# Patient Record
Sex: Female | Born: 1965 | Race: White | Hispanic: No | Marital: Single | Smoking: Never smoker
Health system: Southern US, Community
[De-identification: ages and names within clinical notes are randomized; demographics above are authoritative.]

## PROBLEM LIST (undated history)

## (undated) DIAGNOSIS — M7751 Other enthesopathy of right foot: Secondary | ICD-10-CM

## (undated) DIAGNOSIS — R002 Palpitations: Secondary | ICD-10-CM

## (undated) DIAGNOSIS — R079 Chest pain, unspecified: Secondary | ICD-10-CM

## (undated) DIAGNOSIS — I890 Lymphedema, not elsewhere classified: Secondary | ICD-10-CM

## (undated) DIAGNOSIS — M1990 Unspecified osteoarthritis, unspecified site: Secondary | ICD-10-CM

## (undated) DIAGNOSIS — I878 Other specified disorders of veins: Secondary | ICD-10-CM

## (undated) DIAGNOSIS — M2550 Pain in unspecified joint: Secondary | ICD-10-CM

## (undated) DIAGNOSIS — K219 Gastro-esophageal reflux disease without esophagitis: Secondary | ICD-10-CM

## (undated) DIAGNOSIS — F419 Anxiety disorder, unspecified: Secondary | ICD-10-CM

## (undated) DIAGNOSIS — M779 Enthesopathy, unspecified: Secondary | ICD-10-CM

## (undated) DIAGNOSIS — L089 Local infection of the skin and subcutaneous tissue, unspecified: Secondary | ICD-10-CM

## (undated) DIAGNOSIS — K449 Diaphragmatic hernia without obstruction or gangrene: Secondary | ICD-10-CM

## (undated) DIAGNOSIS — R609 Edema, unspecified: Secondary | ICD-10-CM

## (undated) DIAGNOSIS — C50919 Malignant neoplasm of unspecified site of unspecified female breast: Secondary | ICD-10-CM

## (undated) DIAGNOSIS — D649 Anemia, unspecified: Secondary | ICD-10-CM

## (undated) DIAGNOSIS — M549 Dorsalgia, unspecified: Secondary | ICD-10-CM

## (undated) DIAGNOSIS — I10 Essential (primary) hypertension: Secondary | ICD-10-CM

## (undated) DIAGNOSIS — E079 Disorder of thyroid, unspecified: Secondary | ICD-10-CM

## (undated) HISTORY — DX: Gastro-esophageal reflux disease without esophagitis: K21.9

## (undated) HISTORY — DX: Anxiety disorder, unspecified: F41.9

## (undated) HISTORY — DX: Disorder of thyroid, unspecified: E07.9

## (undated) HISTORY — DX: Other enthesopathy of right foot and ankle: M77.51

## (undated) HISTORY — DX: Chest pain, unspecified: R07.9

## (undated) HISTORY — DX: Diaphragmatic hernia without obstruction or gangrene: K44.9

## (undated) HISTORY — DX: Unspecified osteoarthritis, unspecified site: M19.90

## (undated) HISTORY — DX: Malignant neoplasm of unspecified site of unspecified female breast: C50.919

## (undated) HISTORY — DX: Pain in unspecified joint: M25.50

## (undated) HISTORY — PX: EYE SURGERY: SHX253

## (undated) HISTORY — DX: Palpitations: R00.2

## (undated) HISTORY — DX: Lymphedema, not elsewhere classified: I89.0

## (undated) HISTORY — PX: ABDOMINAL HYSTERECTOMY: SHX81

## (undated) HISTORY — DX: Other specified disorders of veins: I87.8

## (undated) HISTORY — PX: BREAST LUMPECTOMY: SHX2

## (undated) HISTORY — DX: Edema, unspecified: R60.9

## (undated) HISTORY — PX: PARTIAL HYSTERECTOMY: SHX80

## (undated) HISTORY — DX: Dorsalgia, unspecified: M54.9

## (undated) HISTORY — DX: Enthesopathy, unspecified: M77.9

## (undated) HISTORY — DX: Anemia, unspecified: D64.9

## (undated) HISTORY — PX: TONSILLECTOMY: SUR1361

---

## 1998-11-17 ENCOUNTER — Other Ambulatory Visit: Admission: RE | Admit: 1998-11-17 | Discharge: 1998-11-17 | Payer: Self-pay | Admitting: Obstetrics and Gynecology

## 1999-03-04 ENCOUNTER — Ambulatory Visit (HOSPITAL_COMMUNITY): Admission: RE | Admit: 1999-03-04 | Discharge: 1999-03-04 | Payer: Self-pay | Admitting: *Deleted

## 1999-11-28 ENCOUNTER — Other Ambulatory Visit: Admission: RE | Admit: 1999-11-28 | Discharge: 1999-11-28 | Payer: Self-pay | Admitting: Obstetrics and Gynecology

## 2000-09-26 ENCOUNTER — Encounter: Payer: Self-pay | Admitting: Otolaryngology

## 2000-09-26 ENCOUNTER — Encounter: Admission: RE | Admit: 2000-09-26 | Discharge: 2000-09-26 | Payer: Self-pay | Admitting: Otolaryngology

## 2000-11-09 ENCOUNTER — Emergency Department (HOSPITAL_COMMUNITY): Admission: EM | Admit: 2000-11-09 | Discharge: 2000-11-09 | Payer: Self-pay | Admitting: Emergency Medicine

## 2000-11-26 ENCOUNTER — Encounter: Admission: RE | Admit: 2000-11-26 | Discharge: 2000-12-05 | Payer: Self-pay | Admitting: Family Medicine

## 2001-01-18 ENCOUNTER — Other Ambulatory Visit: Admission: RE | Admit: 2001-01-18 | Discharge: 2001-01-18 | Payer: Self-pay | Admitting: Obstetrics and Gynecology

## 2002-01-28 ENCOUNTER — Other Ambulatory Visit: Admission: RE | Admit: 2002-01-28 | Discharge: 2002-01-28 | Payer: Self-pay | Admitting: Obstetrics and Gynecology

## 2002-05-07 ENCOUNTER — Encounter (INDEPENDENT_AMBULATORY_CARE_PROVIDER_SITE_OTHER): Payer: Self-pay

## 2002-05-07 ENCOUNTER — Inpatient Hospital Stay (HOSPITAL_COMMUNITY): Admission: RE | Admit: 2002-05-07 | Discharge: 2002-05-09 | Payer: Self-pay | Admitting: Obstetrics and Gynecology

## 2002-07-31 ENCOUNTER — Encounter: Admission: RE | Admit: 2002-07-31 | Discharge: 2002-08-14 | Payer: Self-pay | Admitting: Family Medicine

## 2002-08-27 ENCOUNTER — Encounter: Payer: Self-pay | Admitting: Family Medicine

## 2002-08-27 ENCOUNTER — Encounter: Admission: RE | Admit: 2002-08-27 | Discharge: 2002-08-27 | Payer: Self-pay | Admitting: Family Medicine

## 2003-03-31 ENCOUNTER — Encounter: Admission: RE | Admit: 2003-03-31 | Discharge: 2003-05-13 | Payer: Self-pay | Admitting: Family Medicine

## 2003-04-20 ENCOUNTER — Other Ambulatory Visit: Admission: RE | Admit: 2003-04-20 | Discharge: 2003-04-20 | Payer: Self-pay | Admitting: Obstetrics and Gynecology

## 2006-02-05 ENCOUNTER — Ambulatory Visit (HOSPITAL_COMMUNITY): Admission: RE | Admit: 2006-02-05 | Discharge: 2006-02-05 | Payer: Self-pay | Admitting: Family Medicine

## 2009-11-17 ENCOUNTER — Other Ambulatory Visit: Admission: RE | Admit: 2009-11-17 | Discharge: 2009-11-17 | Payer: Self-pay | Admitting: Family Medicine

## 2010-09-19 ENCOUNTER — Encounter: Admission: RE | Admit: 2010-09-19 | Discharge: 2010-09-19 | Payer: Self-pay | Admitting: Ophthalmology

## 2011-03-07 ENCOUNTER — Other Ambulatory Visit: Payer: Self-pay | Admitting: Neurology

## 2011-03-07 DIAGNOSIS — G902 Horner's syndrome: Secondary | ICD-10-CM

## 2011-03-07 DIAGNOSIS — H532 Diplopia: Secondary | ICD-10-CM

## 2011-03-07 DIAGNOSIS — R519 Headache, unspecified: Secondary | ICD-10-CM

## 2011-03-17 NOTE — Op Note (Signed)
[facility]  Patient:    Jill Palmer, Jill Palmer Visit Number: [id number] MRN: [id number]          Service Type: GYN Location: 9300 [id number] Attending Physician:  Morene Antu Dictated by:   Sherry A. Rosalio Macadamia, M.D. Proc. Date: 07/08/02 Admit Date:  05/07/2002 Discharge Date: 05/09/2002                             Operative Report  PREOPERATIVE DIAGNOSES:       Fibroid uterus.  POSTOPERATIVE DIAGNOSES:      Fibroid uterus.  PROCEDURE:                    Subtotal total abdominal hysterectomy and lysis of adhesions.  SURGEON:                      Sherry A. Rosalio Macadamia, M.D., Marina Gravel, M.D.  ANESTHESIA:                   General.  INDICATIONS:                  This is a 45 year old G0, P0 woman who has had known fibroid uterus for many years.  The patient has previously undergone two myomectomies, the first one in March 1994 and the second in June 1996.  The patient has continued to have increasing size of her fibroids with menorrhagia.  The patient states that she is not interested in any future pregnancies and does not want conservative surgery at this time.  Because of the size of the uterus and her heavy bleeding, the patient is brought to the operating room for abdominal hysterectomy.  The patient was informed that a supracervical hysterectomy might be performed.  FINDINGS:                     A 12-14 week sized uterus with multiple fibroids, normal ovaries, and retrocecal appendix.  PROCEDURE:                    The patient was brought into the operating room and given adequate general anesthesia.  She was placed in a frog leg position. Her abdomen and vagina were washed with Hibiclens.  Foley catheter was placed in the bladder.  The patient was taken out of the frog leg position and draped sterilely.  Pfannenstiel incision was made and brought down sharply through the fascia.  Fascia was incised sharply.  Fascia was elevated off  of rectus muscles and dissected away.  Rectus muscles were separated in the midline both sharply and bluntly.  Peritoneum was identified and entered sharply.  This incision was extended superiorly and inferiorly under direct visualization. There were multiple adhesions of the omentum to the anterior abdominal wall. These were carefully lysed and clamped and tied with free ties.  Balfour retractor was placed in the abdominal cavity.  Adhesions of the omentum to the anterior uterine wall was also present.  These were dissected, cauterized.  A portion was grasped with the Kelly clamp, cut, and free tied with 2-0 Vicryl free ties.  Kelly clamp was placed along the right side of the uterus.  The right round ligament was suture ligated with 0 Vicryl ligature and incised. The right utero-ovarian ligament was isolated.  It was clamped, cut, free tied, and suture ligated with 0 Vicryl ligature.  Because of the presence  of multiple fibroids and the difficulty to maneuver in the abdomen, several fibroids needed to be removed prior to the hysterectomy.  The serosa was infiltrated with Pitressin solution.  It was incised and the fibroid was dissected free both bluntly and sharply and with cautery.  Approximately three fibroids removed in this fashion.  After these were removed the left round ligament could be identified.  It was suture ligated with 0 Vicryl ligature and incised.  The left utero-ovarian ligament was identified and clamped.  It was cut, free tied with 0 Vicryl free tie, and sutured with 0 Vicryl ligature. The anterior leaf of the broad ligament was incised.  The bladder peritoneum was developed off of the lower uterine segment first sharply and then bluntly. Several other fibroids needed to be removed to be able to continue with the surgery and they were removed in a similar fashion as already dictated.  The cardinal ligaments were then clamped, cut, and suture ligated with 0  Vicryl ligatures on alternating sides until it was felt that uterines had been clamped in this fashion.  The uterus was then dissected off of the cervix with a scalpel.  The cervical stump was grasped with Kocher clamps.  The cervix was very long and it was felt that more of the cervix could be removed.  More of the cardinal ligaments were then clamped, cut, and suture ligated with 0 Vicryl ligature.  Top portion of the cervix was removed with a wedge shaped incision into the cervical canal.  The cervix was then closed with 0 Vicryl figure-of-eight stitches across the top of the cervix.  Only approximately 1-2 cm of cervix remained.  There was some bleeding along the right side of the cardinal ligament vasculature.  This was superficially clamped and tied with 0 Vicryl ligature.  Bleeders were cauterized.  The ovaries were inspected and felt to be normal.  Adhesions of the ovaries were dissected free to free up the ovaries.  The abdomen was irrigated with large amounts of warm saline. Packs were removed.  The appendix was visualized and felt to be normal, although it was retrocecal.  No endometriosis could be seen at this time. After all packs were removed, the Balfour retractor was removed and all the irrigation was removed.  The abdominal cavity was closed.  First cautery of peritoneal and fascial edges was performed.  Adequate hemostasis was present. Fascia was then closed with 0 Vicryl stitches running laterally to midline plus two extra independent 0 Vicryl figure-of-eight stitches in the midline. Bleeders were cauterized.  Subcutaneous tissue was irrigated.  Skin was closed with staples.  Sterile bandage was placed over the wound.  The patient was awakened.  She was extubated.  She was moved from the operating room to a stretcher in stable condition.  COMPLICATIONS:                None.  ESTIMATED BLOOD LOSS:         300 cc. Dictated by:   Sherry A. Rosalio Macadamia, M.D. Attending  Physician:  Morene Antu DD:  05/09/02 TD:  05/12/02 Job: [id number]

## 2011-03-17 NOTE — Discharge Summary (Signed)
[facility]  Patient:    JANILLE, DRAUGHON Visit Number: [id number] MRN: [id number]          Service Type: GYN Location: 9300 [id number] Attending Physician:  Morene Antu Dictated by:   Sherry A. Rosalio Macadamia, M.D. Admit Date:  05/07/2002 Discharge Date: 05/09/2002                             Discharge Summary  PROBLEMS:                     Fibroid uterus and menorrhagia.  SUBJECTIVE:                   The patient is a 45 year old, G0, P0 woman who has had a long history of fibroid uterus.  The patient has had two previous myomectomies but at this time, the patient has had increasing menstrual flow and length of menstruation with increasing size of her uterus once again. Because of this, the patient is brought to the operating room for abdominal hysterectomy.  OBJECTIVE:  PHYSICAL EXAMINATION:  HEENT:                        Within normal limits.  NECK:                         Without any lymphadenopathy.  Thyroid without nodule.  CHEST:                        Clear to auscultation.  HEART:                        Regular rhythm without murmur.  BREASTS:                      Without mass.  CVA:                          Nontender.  ABDOMEN:                      Soft and nontender, obese without mass.  PELVIC:                       External genitalia within normal limits.  Cervix within normal limits.  Uterus anteflexed 12 to 14 weeks size.  Adnexa without any obvious mass but nonpalpable.  HOSPITAL COURSE:              The patient was admitted and brought to the operating room where a subtotal abdominal hysterectomy was performed. Approximately 1 to 2 cm of cervix remained in place.  Lysis of adhesions was also performed.  The patient did well through surgery.  There were no surgical complications.  Postoperatively she did well.  She remained afebrile and her vital signs remained stable.  The patient had a normal postoperative  course including a preoperative hematocrit of 42.2 and a postoperative hematocrit on her first postoperative day of 35.3.  The patient is discharged to home on her second postoperative day.  ASSESSMENT:                   Stable, status post subtotal abdominal hysterectomy and lysis of adhesions.  PLAN:  Follow up in the office in four weeks.  The patient will call if she has a temperature greater than 101, heavy bleeding or severe pain.  She is discharged to home on Darvocet N 100 or Tylox. Dictated by:   Sherry A. Rosalio Macadamia, M.D. Attending Physician:  Morene Antu DD:  05/09/02 TD:  05/12/02 Job: [id number]

## 2011-03-23 ENCOUNTER — Ambulatory Visit
Admission: RE | Admit: 2011-03-23 | Discharge: 2011-03-23 | Disposition: A | Discharge status: Home / Self Care | Payer: 59 | Source: Ambulatory Visit | Attending: Neurology | Admitting: Neurology

## 2011-03-23 DIAGNOSIS — H532 Diplopia: Secondary | ICD-10-CM

## 2011-03-23 DIAGNOSIS — R519 Headache, unspecified: Secondary | ICD-10-CM

## 2011-03-23 DIAGNOSIS — G902 Horner's syndrome: Secondary | ICD-10-CM

## 2011-03-23 MED ORDER — GADOBENATE DIMEGLUMINE 529 MG/ML IV SOLN
20.0000 mL | Freq: Once | INTRAVENOUS | Status: AC | PRN
  Administered 2011-03-23: 20 mL via INTRAVENOUS

## 2012-09-29 ENCOUNTER — Emergency Department (HOSPITAL_COMMUNITY): Payer: Self-pay

## 2012-09-29 ENCOUNTER — Encounter (HOSPITAL_COMMUNITY): Payer: Self-pay | Admitting: Emergency Medicine

## 2012-09-29 ENCOUNTER — Emergency Department (HOSPITAL_COMMUNITY)
Admission: EM | Admit: 2012-09-29 | Discharge: 2012-09-29 | Disposition: A | Discharge status: Home / Self Care | Payer: Self-pay | Attending: Emergency Medicine | Admitting: Emergency Medicine

## 2012-09-29 DIAGNOSIS — Z79899 Other long term (current) drug therapy: Secondary | ICD-10-CM | POA: Insufficient documentation

## 2012-09-29 DIAGNOSIS — R109 Unspecified abdominal pain: Secondary | ICD-10-CM | POA: Insufficient documentation

## 2012-09-29 DIAGNOSIS — I10 Essential (primary) hypertension: Secondary | ICD-10-CM | POA: Insufficient documentation

## 2012-09-29 DIAGNOSIS — Z7982 Long term (current) use of aspirin: Secondary | ICD-10-CM | POA: Insufficient documentation

## 2012-09-29 DIAGNOSIS — Z872 Personal history of diseases of the skin and subcutaneous tissue: Secondary | ICD-10-CM | POA: Insufficient documentation

## 2012-09-29 DIAGNOSIS — R079 Chest pain, unspecified: Secondary | ICD-10-CM | POA: Insufficient documentation

## 2012-09-29 DIAGNOSIS — Z8679 Personal history of other diseases of the circulatory system: Secondary | ICD-10-CM | POA: Insufficient documentation

## 2012-09-29 HISTORY — DX: Local infection of the skin and subcutaneous tissue, unspecified: L08.9

## 2012-09-29 HISTORY — DX: Edema, unspecified: R60.9

## 2012-09-29 HISTORY — DX: Essential (primary) hypertension: I10

## 2012-09-29 LAB — PROTIME-INR
INR: 0.95 (ref 0.00–1.49)
Prothrombin Time: 12.6 seconds (ref 11.6–15.2)

## 2012-09-29 LAB — COMPREHENSIVE METABOLIC PANEL
ALT: 17 U/L (ref 0–35)
AST: 22 U/L (ref 0–37)
Albumin: 3.6 g/dL (ref 3.5–5.2)
Alkaline Phosphatase: 68 U/L (ref 39–117)
BUN: 14 mg/dL (ref 6–23)
CO2: 28 mEq/L (ref 19–32)
Calcium: 9 mg/dL (ref 8.4–10.5)
Chloride: 102 mEq/L (ref 96–112)
Creatinine, Ser: 0.87 mg/dL (ref 0.50–1.10)
GFR calc Af Amer: >90 mL/min (ref >90)
GFR calc non Af Amer: 78 mL/min — ABNORMAL LOW (ref >90)
Glucose, Bld: 76 mg/dL (ref 70–99)
Potassium: 3.6 mEq/L (ref 3.5–5.1)
Sodium: 138 mEq/L (ref 135–145)
Total Bilirubin: 0.1 mg/dL — ABNORMAL LOW (ref 0.3–1.2)
Total Protein: 7.1 g/dL (ref 6.0–8.3)

## 2012-09-29 LAB — CBC
HCT: 41 % (ref 36.0–46.0)
Hemoglobin: 13.6 g/dL (ref 12.0–15.0)
MCH: 27.5 pg (ref 26.0–34.0)
MCHC: 33.2 g/dL (ref 30.0–36.0)
MCV: 82.8 fL (ref 78.0–100.0)
Platelets: 257 10*3/uL (ref 150–400)
RBC: 4.95 MIL/uL (ref 3.87–5.11)
RDW: 14.4 % (ref 11.5–15.5)
WBC: 6.3 10*3/uL (ref 4.0–10.5)

## 2012-09-29 LAB — TROPONIN I: Troponin I: <0.3 ng/mL (ref <0.30)

## 2012-09-29 LAB — PRO B NATRIURETIC PEPTIDE: Pro B Natriuretic peptide (BNP): 107.4 pg/mL (ref 0–125)

## 2012-09-29 LAB — LIPASE, BLOOD: Lipase: 22 U/L (ref 11–59)

## 2012-09-29 LAB — POCT I-STAT TROPONIN I: Troponin i, poc: 0 ng/mL (ref 0.00–0.08)

## 2012-09-29 MED ORDER — FAMOTIDINE 20 MG PO TABS
20.0000 mg | ORAL_TABLET | Freq: Once | ORAL | Status: AC
  Administered 2012-09-29: 20 mg via ORAL
  Filled 2012-09-29: qty 1

## 2012-09-29 MED ORDER — FAMOTIDINE IN NACL 20-0.9 MG/50ML-% IV SOLN
20.0000 mg | Freq: Once | INTRAVENOUS | Status: DC
  Filled 2012-09-29: qty 50

## 2012-09-29 MED ORDER — FAMOTIDINE 20 MG PO TABS: 20.0000 mg | ORAL_TABLET | Freq: Two times a day (BID) | ORAL | Status: DC

## 2012-09-29 MED ORDER — GI COCKTAIL ~~LOC~~
30.0000 mL | Freq: Once | ORAL | Status: AC
  Administered 2012-09-29: 30 mL via ORAL
  Filled 2012-09-29: qty 30

## 2012-09-29 MED ORDER — SODIUM CHLORIDE 0.9 % IV BOLUS (SEPSIS): 1000.0000 mL | Freq: Once | INTRAVENOUS | Status: DC

## 2012-09-29 NOTE — ED Notes (Signed)
Patient c/o centralized chest pain with no associated symptoms.  Patient states she thinks it is R/T stress and high blood pressure.  Patient denies N/V, SOB, dizziness, or light headedness.  Patient states she had similar pain on Wednesday, and the pain came back today.  Patient reports pain of 6/10.  Patient's pain worsens with movement.

## 2012-09-29 NOTE — ED Provider Notes (Signed)
History     CSN: [id number]  Arrival date & time 09/29/12  4098   First MD Initiated Contact with Patient 09/29/12 2004      Chief Complaint  Patient presents with  . Chest Pain    (Consider location/radiation/quality/duration/timing/severity/associated sxs/prior treatment) HPI The patient presents with sternal pain.  The pain began earlier today, insidiously.  Pain began approximately 12 hours ago.  Since onset the pain has been persistent.  Pain is sharp, burning, sternal with no radiation.  Pain is worse with bending anteriorly.  Pain is not exertional or pleuritic.  There is mild increased abdominal discomfort when the patient took aspirin.  She denies concurrent dyspnea, lightheadedness, vomiting or diarrhea. No fam Hx of early cardiac death The patient does not smoke. Past Medical History  Diagnosis Date  . Hypertension   . Edema   . Soft tissue infection     left leg    Past Surgical History  Procedure Date  . Eye surgery     Family History  Problem Relation Age of Onset  . Heart failure Mother   . Heart failure Father     History  Substance Use Topics  . Smoking status: Never Smoker   . Smokeless tobacco: Not on file  . Alcohol Use: No    OB History    Grav Para Term Preterm Abortions TAB SAB Ect Mult Living                  Review of Systems  Constitutional:       Per HPI, otherwise negative  HENT:       Per HPI, otherwise negative  Eyes: Negative.   Respiratory:       Per HPI, otherwise negative  Cardiovascular:       Per HPI, otherwise negative  Gastrointestinal: Negative for vomiting.  Genitourinary: Negative.   Musculoskeletal:       Per HPI, otherwise negative  Skin: Negative.   Neurological: Negative for syncope.    Allergies  Penicillins  Home Medications   Current Outpatient Rx  Name  Route  Sig  Dispense  Refill  . ASPIRIN EC 81 MG PO TBEC   Oral   Take 81 mg by mouth daily.         [name] CALCIUM CARBONATE 600 MG PO  TABS   Oral   Take 600 mg by mouth 2 (two) times daily with a meal.         . ADULT MULTIVITAMIN W/MINERALS CH   Oral   Take 1 tablet by mouth daily.         . SULFAMETHOXAZOLE-TMP DS 800-160 MG PO TABS   Oral   Take 1 tablet by mouth 2 (two) times daily.         . TRIAMTERENE-HCTZ 37.5-25 MG PO TABS   Oral   Take 0.5 tablets by mouth daily as needed. For fluid           BP 149/89  Pulse 99  Temp 98.3 F (36.8 C) (Oral)  Resp 16  Ht 5\' 5"  (1.651 m)  Wt 285 lb (129.275 kg)  BMI 47.43 kg/m2  SpO2 100%  Physical Exam  Nursing note and vitals reviewed. Constitutional: She is oriented to person, place, and time. She appears well-developed and well-nourished. No distress.  HENT:  Head: Normocephalic and atraumatic.  Eyes: Conjunctivae normal and EOM are normal.  Cardiovascular: Normal rate and regular rhythm.   Pulmonary/Chest: Effort normal and breath sounds normal. No  stridor. No respiratory distress.  Abdominal: She exhibits no distension.  Musculoskeletal: She exhibits no edema.  Neurological: She is alert and oriented to person, place, and time. No cranial nerve deficit.  Skin: Skin is warm and dry.  Psychiatric: She has a normal mood and affect.    ED Course  Procedures (including critical care time)   Labs Reviewed  CBC  PROTIME-INR  POCT I-STAT TROPONIN I  PRO B NATRIURETIC PEPTIDE  COMPREHENSIVE METABOLIC PANEL  LIPASE, BLOOD  TROPONIN I   Dg Chest 2 View  09/29/2012  *RADIOLOGY REPORT*  Clinical Data: Chest pain.  Lower extremity swelling.  CHEST - 2 VIEW  Comparison:  03/23/2011  Findings:  The heart size and mediastinal contours are within normal limits.  Both lungs are clear.  The visualized skeletal structures are unremarkable.  IMPRESSION: No active cardiopulmonary disease.   Original Report Authenticated By: Myles Rosenthal, M.D.      No diagnosis found.  Cardiac: 110st, abnormal  O2- 99%ra,normal   Date: 09/29/2012  Rate: 101  Rhythm:  sinus tachycardia  QRS Axis: normal  Intervals: normal  ST/T Wave abnormalities: normal  Conduction Disutrbances:none  Narrative Interpretation:   Old EKG Reviewed: none available ABNORMAL  10:19 PM The patient is sitting upright, generally well-appearing.  She states her pain is significantly improved. MDM  This patient presents with new sternal discomfort.  Given the patient's description and is worse with bending over, there suspicion of GI etiology.  The patient is in no distress with unremarkable vital signs, and her labs are reassuring.  We discussed the likely etiology, the need for continued outpatient followup.  She was discharged in stable condition.  Gerhard Munch, MD 09/29/12 2220

## 2013-01-17 ENCOUNTER — Other Ambulatory Visit: Payer: Self-pay | Admitting: Family Medicine

## 2013-01-17 ENCOUNTER — Other Ambulatory Visit (HOSPITAL_COMMUNITY)
Admission: RE | Admit: 2013-01-17 | Discharge: 2013-01-17 | Disposition: A | Discharge status: Home / Self Care | Payer: BC Managed Care – PPO | Source: Ambulatory Visit | Attending: Family Medicine | Admitting: Family Medicine

## 2013-01-17 DIAGNOSIS — Z0000 Encounter for general adult medical examination without abnormal findings: Secondary | ICD-10-CM | POA: Insufficient documentation

## 2013-02-04 ENCOUNTER — Encounter (HOSPITAL_BASED_OUTPATIENT_CLINIC_OR_DEPARTMENT_OTHER): Payer: BC Managed Care – PPO | Attending: General Surgery

## 2013-02-04 DIAGNOSIS — K449 Diaphragmatic hernia without obstruction or gangrene: Secondary | ICD-10-CM | POA: Insufficient documentation

## 2013-02-04 DIAGNOSIS — I10 Essential (primary) hypertension: Secondary | ICD-10-CM | POA: Insufficient documentation

## 2013-02-04 DIAGNOSIS — K219 Gastro-esophageal reflux disease without esophagitis: Secondary | ICD-10-CM | POA: Insufficient documentation

## 2013-02-04 DIAGNOSIS — I87319 Chronic venous hypertension (idiopathic) with ulcer of unspecified lower extremity: Secondary | ICD-10-CM | POA: Insufficient documentation

## 2013-02-04 DIAGNOSIS — Z79899 Other long term (current) drug therapy: Secondary | ICD-10-CM | POA: Insufficient documentation

## 2013-02-04 DIAGNOSIS — L97909 Non-pressure chronic ulcer of unspecified part of unspecified lower leg with unspecified severity: Secondary | ICD-10-CM | POA: Insufficient documentation

## 2013-02-05 NOTE — H&P (Signed)
NAMEBRYANDA, Jill Palmer               ACCOUNT NO.:  [id number]  MEDICAL RECORD NO.:  [id number]  LOCATION:  FOOT                         FACILITY:  [facility]  PHYSICIAN:  Joanne Gavel, M.D.        DATE OF BIRTH:  1966-08-04  DATE OF ADMISSION:  02/04/2013 DATE OF DISCHARGE:                             HISTORY & PHYSICAL   CHIEF COMPLAINT:  Sores, both legs.  HISTORY OF PRESENT ILLNESS:  This is a 47 year old female, developed blisters and infection approximately 1 year ago.  At that time, there was a great deal of discoloration particularly of the left leg.  There was several blisters and skin breakdown starting in August.  PAST MEDICAL HISTORY:  She has had a recent weight gain, thought to be secondary to anxiety.  She has reflux and hiatal hernia, plantar fasciitis, Horner syndrome, osteoarthritis of the neck and shoulder, hypertension, osteoarthritis and tendinitis in hip and shoulder.  PAST SURGICAL HISTORY:  She has had several eye operations, hysterectomy, and fibroids excised several different times.  Cigarettes none.  Alcohol none.  MEDICATIONS:  Vitamins and Maxzide.  ALLERGY:  Penicillin, Septra.  REVIEW OF SYSTEMS:  Essentially as above.  PHYSICAL EXAMINATION:  VITAL SIGNS:  Temperature 98.3, pulse 73, respirations 18, blood pressure 145/84. GENERAL APPEARANCE:  Well developed, somewhat obese, in no distress. HEAD, EYES, EARS, NOSE, AND THROAT:  Normal. NECK:  Supple. CHEST:  Clear. HEART:  Regular rhythm. ABDOMEN:  Not examined. EXTREMITIES: Examination of the lower extremities, ABI was measured at 1.2 on the right leg, could not be obtained on the left leg.  I was unable to feel pulses in either foot, both legs are quite swollen and there is a great deal of redness particularly anterior left leg.  There are 3 wounds on the left leg and 1 wound on the right leg.  The right leg wound is 0.4 x 1.4.  The left leg wounds are 1 x 1.2 and 1.9 x 0.7, and approximately 1 x  1.  IMPRESSION:  Chronic venous hypertension with ulceration.  PLAN:  I have debrided slough of both these wounds and we will start with Kerlix, Coban, and silver alginate.  We will get arterial and venous studies and that the arterial studies are okay, we will treat with Unna boots.  She has been told to elevate whenever possible.  We will see her in 7 days.     Joanne Gavel, M.D.     RA/MEDQ  D:  02/04/2013  T:  02/05/2013  Job:  [id number]

## 2013-02-06 ENCOUNTER — Other Ambulatory Visit (HOSPITAL_COMMUNITY): Payer: Self-pay | Admitting: General Surgery

## 2013-02-06 DIAGNOSIS — I878 Other specified disorders of veins: Secondary | ICD-10-CM

## 2013-02-06 DIAGNOSIS — I739 Peripheral vascular disease, unspecified: Secondary | ICD-10-CM

## 2013-02-11 ENCOUNTER — Ambulatory Visit (HOSPITAL_COMMUNITY)
Admission: RE | Admit: 2013-02-11 | Discharge: 2013-02-11 | Disposition: A | Discharge status: Home / Self Care | Payer: BC Managed Care – PPO | Source: Ambulatory Visit | Attending: Cardiovascular Disease | Admitting: Cardiovascular Disease

## 2013-02-11 DIAGNOSIS — I872 Venous insufficiency (chronic) (peripheral): Secondary | ICD-10-CM | POA: Insufficient documentation

## 2013-02-11 DIAGNOSIS — I878 Other specified disorders of veins: Secondary | ICD-10-CM

## 2013-02-11 DIAGNOSIS — I739 Peripheral vascular disease, unspecified: Secondary | ICD-10-CM

## 2013-02-11 DIAGNOSIS — M7989 Other specified soft tissue disorders: Secondary | ICD-10-CM | POA: Insufficient documentation

## 2013-02-11 NOTE — Progress Notes (Signed)
Venous Duplex Lower Ext. Completed. Kelsey Durflinger D  

## 2013-02-11 NOTE — Progress Notes (Signed)
Arterial Duplex Lower Ext. Completed. Dorotha Hirschi D  

## 2013-02-15 ENCOUNTER — Encounter (HOSPITAL_COMMUNITY): Payer: Self-pay | Admitting: *Deleted

## 2013-02-15 ENCOUNTER — Emergency Department (HOSPITAL_COMMUNITY)
Admission: EM | Admit: 2013-02-15 | Discharge: 2013-02-16 | Disposition: A | Discharge status: Home / Self Care | Payer: BC Managed Care – PPO | Attending: Emergency Medicine | Admitting: Emergency Medicine

## 2013-02-15 DIAGNOSIS — I878 Other specified disorders of veins: Secondary | ICD-10-CM

## 2013-02-15 DIAGNOSIS — Z8619 Personal history of other infectious and parasitic diseases: Secondary | ICD-10-CM | POA: Insufficient documentation

## 2013-02-15 DIAGNOSIS — I872 Venous insufficiency (chronic) (peripheral): Secondary | ICD-10-CM | POA: Insufficient documentation

## 2013-02-15 DIAGNOSIS — I10 Essential (primary) hypertension: Secondary | ICD-10-CM | POA: Insufficient documentation

## 2013-02-15 DIAGNOSIS — L02419 Cutaneous abscess of limb, unspecified: Secondary | ICD-10-CM | POA: Insufficient documentation

## 2013-02-15 DIAGNOSIS — L03119 Cellulitis of unspecified part of limb: Secondary | ICD-10-CM

## 2013-02-15 DIAGNOSIS — Z8679 Personal history of other diseases of the circulatory system: Secondary | ICD-10-CM | POA: Insufficient documentation

## 2013-02-15 DIAGNOSIS — Z79899 Other long term (current) drug therapy: Secondary | ICD-10-CM | POA: Insufficient documentation

## 2013-02-15 MED ORDER — CLINDAMYCIN PHOSPHATE 600 MG/50ML IV SOLN
600.0000 mg | Freq: Once | INTRAVENOUS | Status: AC
  Administered 2013-02-16: 600 mg via INTRAVENOUS
  Filled 2013-02-15: qty 50

## 2013-02-15 NOTE — ED Notes (Signed)
Pt has chronic bilateral lower extremity edema w/ leg ulcers. Pt presents w/ increased pain and redness to RLE. Pt denies ShoB and CP. Pt has blistering and weeping on RLE. LLE is wrapped in compression bandage from wound clinic still.

## 2013-02-16 LAB — BASIC METABOLIC PANEL
BUN: 16 mg/dL (ref 6–23)
CO2: 25 mEq/L (ref 19–32)
Calcium: 9.6 mg/dL (ref 8.4–10.5)
Chloride: 101 mEq/L (ref 96–112)
Creatinine, Ser: 0.9 mg/dL (ref 0.50–1.10)
GFR calc Af Amer: 86 mL/min — ABNORMAL LOW (ref >90)
GFR calc non Af Amer: 74 mL/min — ABNORMAL LOW (ref >90)
Glucose, Bld: 96 mg/dL (ref 70–99)
Potassium: 3.8 mEq/L (ref 3.5–5.1)
Sodium: 136 mEq/L (ref 135–145)

## 2013-02-16 LAB — CBC WITH DIFFERENTIAL/PLATELET
Basophils Absolute: 0 10*3/uL (ref 0.0–0.1)
Basophils Relative: 0 % (ref 0–1)
Eosinophils Absolute: 0.1 10*3/uL (ref 0.0–0.7)
Eosinophils Relative: 1 % (ref 0–5)
HCT: 43.7 % (ref 36.0–46.0)
Hemoglobin: 14.6 g/dL (ref 12.0–15.0)
Lymphocytes Relative: 17 % (ref 12–46)
Lymphs Abs: 1.7 10*3/uL (ref 0.7–4.0)
MCH: 27.3 pg (ref 26.0–34.0)
MCHC: 33.4 g/dL (ref 30.0–36.0)
MCV: 81.7 fL (ref 78.0–100.0)
Monocytes Absolute: 0.8 10*3/uL (ref 0.1–1.0)
Monocytes Relative: 8 % (ref 3–12)
Neutro Abs: 7.5 10*3/uL (ref 1.7–7.7)
Neutrophils Relative %: 74 % (ref 43–77)
Platelets: 251 10*3/uL (ref 150–400)
RBC: 5.35 MIL/uL — ABNORMAL HIGH (ref 3.87–5.11)
RDW: 14.1 % (ref 11.5–15.5)
WBC: 10.1 10*3/uL (ref 4.0–10.5)

## 2013-02-16 MED ORDER — CLINDAMYCIN HCL 150 MG PO CAPS: 300.0000 mg | ORAL_CAPSULE | Freq: Four times a day (QID) | ORAL | Status: DC

## 2013-02-16 MED ORDER — OXYCODONE-ACETAMINOPHEN 5-325 MG PO TABS: 2.0000 | ORAL_TABLET | ORAL | Status: DC | PRN

## 2013-02-16 MED ORDER — OXYCODONE-ACETAMINOPHEN 5-325 MG PO TABS: 2.0000 | ORAL_TABLET | Freq: Once | ORAL | Status: DC

## 2013-02-16 NOTE — ED Notes (Signed)
Patient cellulitis is marked.

## 2013-02-16 NOTE — ED Provider Notes (Signed)
History     CSN: [id number]  Arrival date & time 02/15/13  2300   First MD Initiated Contact with Patient 02/15/13 2328      Chief Complaint  Patient presents with  . Leg Pain    (Consider location/radiation/quality/duration/timing/severity/associated sxs/prior treatment) HPI  47 year old female with history of chronic venous stasis presents to the emergency department with complaint of increasing pain and redness to her right lower leg.  Patient is concerned she may have a blood clot.  She had arterial and venous vascular studies done this past Tuesday.  She has not yet heard the results.  She denies any fever or chills.  She thinks she may have had cellulitis in the past.  Patient is followed by the wound clinic, was seen last week, and had her legs wrapped.  She unwrapped her right leg today when pain worsened.  She noted that she had a new blister at the right anterior part of her leg.  She is concerned that this is caused by a blood clot.  No pain in the posterior part of her leg.  No increased swelling that she can determine.  Patient has followup this upcoming Tuesday, 3 days from now.  Past Medical History  Diagnosis Date  . Hypertension   . Edema   . Soft tissue infection     left leg    Past Surgical History  Procedure Laterality Date  . Eye surgery      Family History  Problem Relation Age of Onset  . Heart failure Mother   . Heart failure Father     History  Substance Use Topics  . Smoking status: Never Smoker   . Smokeless tobacco: Not on file  . Alcohol Use: No    OB History   Grav Para Term Preterm Abortions TAB SAB Ect Mult Living                  Review of Systems  See History of Present Illness; otherwise all other systems are reviewed and negative Allergies  Septra; Latex; and Penicillins  Home Medications   Current Outpatient Rx  Name  Route  Sig  Dispense  Refill  . calcium carbonate (OS-CAL) 600 MG TABS   Oral   Take 600 mg by mouth  every morning.          . Multiple Vitamin (MULTIVITAMIN WITH MINERALS) TABS   Oral   Take 1 tablet by mouth every morning.          . triamterene-hydrochlorothiazide (MAXZIDE-25) 37.5-25 MG per tablet   Oral   Take 0.5 tablets by mouth every morning. For fluid           BP 138/88  Pulse 115  Temp(Src) 99.6 F (37.6 C) (Oral)  Resp 16  SpO2 97%  Physical Exam  Nursing note and vitals reviewed. Constitutional: She is oriented to person, place, and time. She appears well-developed and well-nourished. She appears distressed (anxious appearing).  HENT:  Head: Normocephalic and atraumatic.  Nose: Nose normal.  Mouth/Throat: Oropharynx is clear and moist.  Eyes: Conjunctivae and EOM are normal. Pupils are equal, round, and reactive to light.  Neck: Normal range of motion. Neck supple. No JVD present. No tracheal deviation present. No thyromegaly present.  Cardiovascular: Normal rate, regular rhythm, normal heart sounds and intact distal pulses.  Exam reveals no gallop and no friction rub.   No murmur heard. Pulmonary/Chest: Effort normal and breath sounds normal. No stridor. No respiratory distress. She  has no wheezes. She has no rales. She exhibits no tenderness.  Abdominal: Soft. Bowel sounds are normal. She exhibits no distension and no mass. There is no tenderness. There is no rebound and no guarding.  Musculoskeletal: Normal range of motion. She exhibits edema and tenderness.  Patient has her left leg wrapped in a compressive bandage from the wound clinic.  Right leg has been unwrapped.  She has a 2 cm blister to the right lower anterior leg that is weeping clear fluid.  She has a healing ulcer to the right anterior mid lower leg.  There is erythema and warmth to the anterior portion of her right lower leg.  This travels about two thirds the way up the leg.  There is no posterior leg pain.  Not negative Homans sign.  She has normal range of motion, pulses and sensation to the  leg  Lymphadenopathy:    She has no cervical adenopathy.  Neurological: She is alert and oriented to person, place, and time. She exhibits normal muscle tone. Coordination normal.  Skin: Skin is warm and dry. No rash noted. There is erythema. No pallor.  Psychiatric: She has a normal mood and affect. Her behavior is normal. Judgment and thought content normal.    ED Course  Procedures (including critical care time)  Labs Reviewed  CBC WITH DIFFERENTIAL - Abnormal; Notable for the following:    RBC 5.35 (*)    All other components within normal limits  BASIC METABOLIC PANEL - Abnormal; Notable for the following:    GFR calc non Af Amer 74 (*)    GFR calc Af Amer 86 (*)    All other components within normal limits  CULTURE, BLOOD (ROUTINE X 2)  CULTURE, BLOOD (ROUTINE X 2)   No results found.   1. Cellulitis of lower leg   2. Venous stasis syndrome       MDM  47 year old female with early cellulitis to the right lower leg.  She does have good followup planned for Tuesday.  We'll check baseline labs, give a dose of IV antibiotics.  Will have nursing staff outline the area of cellulitis.  Patient instructed to return to the emergency room if this worsens over the next 24-48 hours.        Olivia Mackie, MD 02/16/13 [phone]

## 2013-02-22 LAB — CULTURE, BLOOD (ROUTINE X 2)
Culture: NO GROWTH
Culture: NO GROWTH

## 2013-03-04 ENCOUNTER — Encounter (HOSPITAL_BASED_OUTPATIENT_CLINIC_OR_DEPARTMENT_OTHER): Payer: BC Managed Care – PPO | Attending: General Surgery

## 2013-03-04 DIAGNOSIS — L97909 Non-pressure chronic ulcer of unspecified part of unspecified lower leg with unspecified severity: Secondary | ICD-10-CM | POA: Insufficient documentation

## 2013-03-04 DIAGNOSIS — I87319 Chronic venous hypertension (idiopathic) with ulcer of unspecified lower extremity: Secondary | ICD-10-CM | POA: Insufficient documentation

## 2013-04-30 ENCOUNTER — Encounter (HOSPITAL_BASED_OUTPATIENT_CLINIC_OR_DEPARTMENT_OTHER): Payer: BC Managed Care – PPO | Attending: General Surgery

## 2014-02-25 ENCOUNTER — Other Ambulatory Visit: Payer: Self-pay | Admitting: Dermatology

## 2014-12-11 ENCOUNTER — Other Ambulatory Visit: Payer: Self-pay | Admitting: Dermatology

## 2016-02-17 DIAGNOSIS — M79671 Pain in right foot: Secondary | ICD-10-CM | POA: Diagnosis not present

## 2016-02-17 DIAGNOSIS — M79672 Pain in left foot: Secondary | ICD-10-CM | POA: Diagnosis not present

## 2016-02-21 DIAGNOSIS — H10413 Chronic giant papillary conjunctivitis, bilateral: Secondary | ICD-10-CM | POA: Diagnosis not present

## 2016-02-21 DIAGNOSIS — H5213 Myopia, bilateral: Secondary | ICD-10-CM | POA: Diagnosis not present

## 2016-02-28 DIAGNOSIS — M79671 Pain in right foot: Secondary | ICD-10-CM | POA: Diagnosis not present

## 2016-04-06 ENCOUNTER — Other Ambulatory Visit (HOSPITAL_COMMUNITY)
Admission: RE | Admit: 2016-04-06 | Discharge: 2016-04-06 | Disposition: A | Discharge status: Home / Self Care | Payer: BLUE CROSS/BLUE SHIELD | Source: Ambulatory Visit | Attending: Family Medicine | Admitting: Family Medicine

## 2016-04-06 ENCOUNTER — Other Ambulatory Visit: Payer: Self-pay | Admitting: Family Medicine

## 2016-04-06 DIAGNOSIS — Z1151 Encounter for screening for human papillomavirus (HPV): Secondary | ICD-10-CM | POA: Diagnosis not present

## 2016-04-06 DIAGNOSIS — Z124 Encounter for screening for malignant neoplasm of cervix: Secondary | ICD-10-CM | POA: Insufficient documentation

## 2016-04-06 DIAGNOSIS — R609 Edema, unspecified: Secondary | ICD-10-CM | POA: Diagnosis not present

## 2016-04-06 DIAGNOSIS — Z0000 Encounter for general adult medical examination without abnormal findings: Secondary | ICD-10-CM | POA: Diagnosis not present

## 2016-04-06 DIAGNOSIS — I10 Essential (primary) hypertension: Secondary | ICD-10-CM | POA: Diagnosis not present

## 2016-04-06 DIAGNOSIS — Z713 Dietary counseling and surveillance: Secondary | ICD-10-CM | POA: Diagnosis not present

## 2016-04-06 DIAGNOSIS — Z13220 Encounter for screening for lipoid disorders: Secondary | ICD-10-CM | POA: Diagnosis not present

## 2016-04-07 LAB — CYTOLOGY - PAP

## 2016-05-09 DIAGNOSIS — Z1231 Encounter for screening mammogram for malignant neoplasm of breast: Secondary | ICD-10-CM | POA: Diagnosis not present

## 2016-05-30 DIAGNOSIS — H10413 Chronic giant papillary conjunctivitis, bilateral: Secondary | ICD-10-CM | POA: Diagnosis not present

## 2016-06-09 DIAGNOSIS — J069 Acute upper respiratory infection, unspecified: Secondary | ICD-10-CM | POA: Diagnosis not present

## 2016-06-09 DIAGNOSIS — J029 Acute pharyngitis, unspecified: Secondary | ICD-10-CM | POA: Diagnosis not present

## 2016-09-27 DIAGNOSIS — L821 Other seborrheic keratosis: Secondary | ICD-10-CM | POA: Diagnosis not present

## 2016-09-27 DIAGNOSIS — L918 Other hypertrophic disorders of the skin: Secondary | ICD-10-CM | POA: Diagnosis not present

## 2016-09-27 DIAGNOSIS — D225 Melanocytic nevi of trunk: Secondary | ICD-10-CM | POA: Diagnosis not present

## 2016-09-27 DIAGNOSIS — L814 Other melanin hyperpigmentation: Secondary | ICD-10-CM | POA: Diagnosis not present

## 2016-12-01 DIAGNOSIS — H10413 Chronic giant papillary conjunctivitis, bilateral: Secondary | ICD-10-CM | POA: Diagnosis not present

## 2016-12-01 DIAGNOSIS — H5213 Myopia, bilateral: Secondary | ICD-10-CM | POA: Diagnosis not present

## 2017-03-15 DIAGNOSIS — R946 Abnormal results of thyroid function studies: Secondary | ICD-10-CM | POA: Diagnosis not present

## 2017-03-15 DIAGNOSIS — R1032 Left lower quadrant pain: Secondary | ICD-10-CM | POA: Diagnosis not present

## 2017-03-15 DIAGNOSIS — R609 Edema, unspecified: Secondary | ICD-10-CM | POA: Diagnosis not present

## 2017-03-23 ENCOUNTER — Other Ambulatory Visit: Payer: Self-pay | Admitting: Family Medicine

## 2017-03-23 DIAGNOSIS — R1032 Left lower quadrant pain: Secondary | ICD-10-CM

## 2017-03-28 DIAGNOSIS — S93602A Unspecified sprain of left foot, initial encounter: Secondary | ICD-10-CM | POA: Diagnosis not present

## 2017-03-28 DIAGNOSIS — M79672 Pain in left foot: Secondary | ICD-10-CM | POA: Diagnosis not present

## 2017-04-16 DIAGNOSIS — R635 Abnormal weight gain: Secondary | ICD-10-CM | POA: Diagnosis not present

## 2017-04-16 DIAGNOSIS — N951 Menopausal and female climacteric states: Secondary | ICD-10-CM | POA: Diagnosis not present

## 2017-04-19 DIAGNOSIS — E039 Hypothyroidism, unspecified: Secondary | ICD-10-CM | POA: Diagnosis not present

## 2017-04-19 DIAGNOSIS — I10 Essential (primary) hypertension: Secondary | ICD-10-CM | POA: Diagnosis not present

## 2017-04-19 DIAGNOSIS — E7800 Pure hypercholesterolemia, unspecified: Secondary | ICD-10-CM | POA: Diagnosis not present

## 2017-04-30 DIAGNOSIS — E7800 Pure hypercholesterolemia, unspecified: Secondary | ICD-10-CM | POA: Diagnosis not present

## 2017-04-30 DIAGNOSIS — I10 Essential (primary) hypertension: Secondary | ICD-10-CM | POA: Diagnosis not present

## 2017-05-08 DIAGNOSIS — I10 Essential (primary) hypertension: Secondary | ICD-10-CM | POA: Diagnosis not present

## 2017-05-08 DIAGNOSIS — E7800 Pure hypercholesterolemia, unspecified: Secondary | ICD-10-CM | POA: Diagnosis not present

## 2017-05-10 DIAGNOSIS — Z1231 Encounter for screening mammogram for malignant neoplasm of breast: Secondary | ICD-10-CM | POA: Diagnosis not present

## 2017-05-15 DIAGNOSIS — E7800 Pure hypercholesterolemia, unspecified: Secondary | ICD-10-CM | POA: Diagnosis not present

## 2017-05-15 DIAGNOSIS — I10 Essential (primary) hypertension: Secondary | ICD-10-CM | POA: Diagnosis not present

## 2017-05-21 ENCOUNTER — Ambulatory Visit
Admission: RE | Admit: 2017-05-21 | Discharge: 2017-05-21 | Disposition: A | Discharge status: Home / Self Care | Payer: BLUE CROSS/BLUE SHIELD | Source: Ambulatory Visit | Attending: Family Medicine | Admitting: Family Medicine

## 2017-05-21 DIAGNOSIS — R1032 Left lower quadrant pain: Secondary | ICD-10-CM

## 2017-05-22 DIAGNOSIS — E7800 Pure hypercholesterolemia, unspecified: Secondary | ICD-10-CM | POA: Diagnosis not present

## 2017-05-22 DIAGNOSIS — I10 Essential (primary) hypertension: Secondary | ICD-10-CM | POA: Diagnosis not present

## 2017-07-30 DIAGNOSIS — R946 Abnormal results of thyroid function studies: Secondary | ICD-10-CM | POA: Diagnosis not present

## 2017-07-30 DIAGNOSIS — Z13220 Encounter for screening for lipoid disorders: Secondary | ICD-10-CM | POA: Diagnosis not present

## 2017-08-06 DIAGNOSIS — R002 Palpitations: Secondary | ICD-10-CM | POA: Diagnosis not present

## 2017-08-06 DIAGNOSIS — R42 Dizziness and giddiness: Secondary | ICD-10-CM | POA: Diagnosis not present

## 2017-08-06 DIAGNOSIS — Z0000 Encounter for general adult medical examination without abnormal findings: Secondary | ICD-10-CM | POA: Diagnosis not present

## 2017-08-06 DIAGNOSIS — R609 Edema, unspecified: Secondary | ICD-10-CM | POA: Diagnosis not present

## 2017-08-07 ENCOUNTER — Telehealth: Payer: Self-pay

## 2017-08-07 NOTE — Telephone Encounter (Signed)
NOTES SENT TO SCHEDULING.  °

## 2017-08-27 ENCOUNTER — Encounter: Payer: Self-pay | Admitting: *Deleted

## 2017-09-03 DIAGNOSIS — M79671 Pain in right foot: Secondary | ICD-10-CM | POA: Diagnosis not present

## 2017-09-10 ENCOUNTER — Encounter: Payer: Self-pay | Admitting: Cardiovascular Disease

## 2017-09-10 ENCOUNTER — Ambulatory Visit (INDEPENDENT_AMBULATORY_CARE_PROVIDER_SITE_OTHER): Payer: BLUE CROSS/BLUE SHIELD | Admitting: Cardiovascular Disease

## 2017-09-10 VITALS — BP 148/94 | HR 76 | Ht 65.0 in | Wt 310.4 lb

## 2017-09-10 DIAGNOSIS — R42 Dizziness and giddiness: Secondary | ICD-10-CM

## 2017-09-10 NOTE — Progress Notes (Signed)
Cardiology Office Note:    Date:  09/10/2017   ID:  Jill Palmer Jill Palmer, DOB January 26, 1966, MRN [id number]  PCP:  Laurann MontanaWhite, Cynthia, MD  Cardiologist:  Kristeen MissPhilip Parnika Tweten, MD    Referring MD: Laurann MontanaWhite, Cynthia, MD   Chief Complaint  Patient presents with  . Palpitations    History of Present Illness:    Jill Palmer Jill Palmer is a 51 y.o. female who is being seen today for the evaluation of palpitations  at the request of Laurann MontanaWhite, Cynthia, MD.   Jill Palmer Jill Palmer is a 51 y.o. female with a hx of  Obesity. She has had some recent episodes of dizziness ( "zoning out")  No syncope  No nausea , vomitting , Was going to eat lunch,  - had not eaten in several hours  Does not get any regular exercise  Has had some orthopedic issues.    Used to do [employer]. 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    Past Medical History:  Diagnosis Date  . Edema   . Hypertension   . Soft tissue infection    left leg    Past Surgical History:  Procedure Laterality Date  . EYE SURGERY      Current Medications: Current Meds  Medication Sig  . calcium carbonate (OS-CAL) 600 MG TABS Take 600 mg by mouth every morning.   . Multiple Vitamin (MULTIVITAMIN WITH MINERALS) TABS Take 1 tablet by mouth every morning.   . triamterene-hydrochlorothiazide (MAXZIDE-25) 37.5-25 MG per tablet Take 1 tablet every morning by mouth. For fluid     Allergies:   Septra [sulfamethoxazole-trimethoprim]; Latex; and Penicillins   Social History   Socioeconomic History  . Marital status: Single    Spouse name: None  . Number of children: None  . Years of education: None  . Highest education level: None  Social Needs  . Financial resource strain: None  . Food insecurity - worry: None  . Food insecurity - inability: None  . Transportation needs - medical: None  . Transportation needs - non-medical: None  Occupational History  . None  Tobacco Use  . Smoking status: Never Smoker  . Smokeless tobacco: Never Used    Substance and Sexual Activity  . Alcohol use: No  . Drug use: No  . Sexual activity: Not Currently  Other Topics Concern  . None  Social History Narrative  . None     Family History: The patient's family history includes Asthma in her brother and mother; Bell's palsy in her father; Bone cancer in her paternal uncle; Breast cancer in her other; CAD in her father, mother, paternal grandfather, and paternal uncle; COPD in her mother; CVA in her mother; Colon cancer in her maternal grandmother and paternal uncle; Colon polyps in her other; Depression in her mother; Edema in her sister; Heart failure in her father and mother; Hypertension in her brother, mother, and sister; Other in her brother; Paranoid behavior in her mother; Rheum arthritis in her other; Schizophrenia in her mother. There is no history of Liver disease. ROS:   Please see the history of present illness.     All other systems reviewed and are negative.  EKGs/Labs/Other Studies Reviewed:    The following studies were reviewed today:   EKG:  EKG is  ordered today.  September 10, 2017: Normal sinus rhythm with sinus arrhythmia.  Nonspecific ST and T wave changes.  Recent Labs: No results found for requested labs within last 8760 hours.  Recent Lipid Panel No results found for: CHOL, TRIG, HDL, CHOLHDL, VLDL, LDLCALC, LDLDIRECT  Physical Exam:    VS:  BP (!) 148/94   Pulse 76   Ht 5\' 5"  (1.651 m)   Wt (!) 310 lb 6.4 oz (140.8 kg)   SpO2 99%   BMI 51.65 kg/m     Wt Readings from Last 3 Encounters:  09/10/17 (!) 310 lb 6.4 oz (140.8 kg)  09/29/12 285 lb (129.3 kg)    General:  Morbidly obese female  in no acute distress HEENT: Normal NECK: No JVD; No carotid bruits LYMPHATICS: No lymphadenopathy CARDIAC: RR, no murmurs, rubs, gallops RESPIRATORY:  Clear to auscultation without rales, wheezing or rhonchi  ABDOMEN: Soft, non-tender, non-distended MUSCULOSKELETAL:   4+ lymphedema / venous stasis  SKIN: Warm and  dry NEUROLOGIC:  Alert and oriented x 3 PSYCHIATRIC:  Normal affect   ASSESSMENT:    1. Light headedness    PLAN:    In order of problems listed above:  1. Lightheadedness: [name] presents with an episode of lightheadedness.  It is unclear whether this was just due to volume depletion because it was right before lunch or perhaps due to the fact that she is on blood pressure medicines and might have been volume depleted.  She denies any syncope.  I would like to place a 30-day event monitor on her for further evaluation of her heart.  I will see her back in 3 months for follow-up visit.  2.  Morbid obesity: [name] has numerous orthopedic problems and high blood pressure that is being worsened by her morbid obesity.  I strongly recommended that she start a good weight loss program.  She has orthopedic injuries that preclude her from walking right now but I recommended that she join a gym and start working out on machines that will not exacerbate her orthopedic injuries.   Medication Adjustments/Labs and Tests Ordered: Current medicines are reviewed at length with the patient today.  Concerns regarding medicines are outlined above.  Orders Placed This Encounter  Procedures  . Cardiac event monitor  . EKG 12-Lead   No orders of the defined types were placed in this encounter.   Signed, Kristeen MissPhilip Peony Barner, MD  09/10/2017 2:02 PM    [facility]

## 2017-09-10 NOTE — Patient Instructions (Signed)
Medication Instructions:  Your physician recommends that you continue on your current medications as directed. Please refer to the Current Medication list given to you today.   Labwork: None Ordered   Testing/Procedures: Your physician has recommended that you wear an event monitor. Event monitors are medical devices that record the heart's electrical activity. Doctors most often us these monitors to diagnose arrhythmias. Arrhythmias are problems with the speed or rhythm of the heartbeat. The monitor is a small, portable device. You can wear one while you do your normal daily activities. This is usually used to diagnose what is causing palpitations/syncope (passing out).   Follow-Up: Your physician recommends that you schedule a follow-up appointment in: 3 months with Dr. Nahser   If you need a refill on your cardiac medications before your next appointment, please call your pharmacy.   Thank you for choosing [facility]! Findlay Dagher, RN [phone]    

## 2017-09-13 ENCOUNTER — Ambulatory Visit (INDEPENDENT_AMBULATORY_CARE_PROVIDER_SITE_OTHER): Payer: BLUE CROSS/BLUE SHIELD

## 2017-09-13 DIAGNOSIS — R42 Dizziness and giddiness: Secondary | ICD-10-CM

## 2017-09-26 DIAGNOSIS — L814 Other melanin hyperpigmentation: Secondary | ICD-10-CM | POA: Diagnosis not present

## 2017-09-26 DIAGNOSIS — D225 Melanocytic nevi of trunk: Secondary | ICD-10-CM | POA: Diagnosis not present

## 2017-09-26 DIAGNOSIS — L821 Other seborrheic keratosis: Secondary | ICD-10-CM | POA: Diagnosis not present

## 2017-09-26 DIAGNOSIS — L905 Scar conditions and fibrosis of skin: Secondary | ICD-10-CM | POA: Diagnosis not present

## 2017-10-18 ENCOUNTER — Telehealth: Payer: Self-pay | Admitting: Cardiovascular Disease

## 2017-10-18 NOTE — Telephone Encounter (Signed)
New message   Patient calling for results of Cardiac event monitor.  Please call, okay to leave detailed message.

## 2017-10-18 NOTE — Telephone Encounter (Signed)
Left message for patient to call back  

## 2017-10-19 NOTE — Telephone Encounter (Signed)
Informed patient of monitor results.  She has follow up with Dr. Elease HashimotoNahser on 12/11/17.  Is aware of this appointment.  No recent dizzy or lightheaded episodes.  Will call back if any concerns.

## 2017-12-03 DIAGNOSIS — R05 Cough: Secondary | ICD-10-CM | POA: Diagnosis not present

## 2017-12-04 ENCOUNTER — Encounter: Payer: Self-pay | Admitting: Cardiovascular Disease

## 2017-12-11 ENCOUNTER — Ambulatory Visit: Payer: BLUE CROSS/BLUE SHIELD | Admitting: Cardiovascular Disease

## 2017-12-20 ENCOUNTER — Other Ambulatory Visit: Payer: Self-pay | Admitting: Family Medicine

## 2017-12-20 ENCOUNTER — Ambulatory Visit
Admission: RE | Admit: 2017-12-20 | Discharge: 2017-12-20 | Disposition: A | Discharge status: Home / Self Care | Payer: BLUE CROSS/BLUE SHIELD | Source: Ambulatory Visit | Attending: Family Medicine | Admitting: Family Medicine

## 2017-12-20 DIAGNOSIS — R059 Cough, unspecified: Secondary | ICD-10-CM

## 2017-12-20 DIAGNOSIS — R05 Cough: Secondary | ICD-10-CM

## 2018-01-10 DIAGNOSIS — H5213 Myopia, bilateral: Secondary | ICD-10-CM | POA: Diagnosis not present

## 2018-01-10 DIAGNOSIS — H04123 Dry eye syndrome of bilateral lacrimal glands: Secondary | ICD-10-CM | POA: Diagnosis not present

## 2018-05-11 IMAGING — CR DG CHEST 2V
2 series · 2 of 2 positions shown · non-contrast
Comparison: None.

CLINICAL DATA: Cough, right anterior lower chest pain for several
weeks, non smoker

EXAM:
CHEST  2 VIEW

[w chest pa]
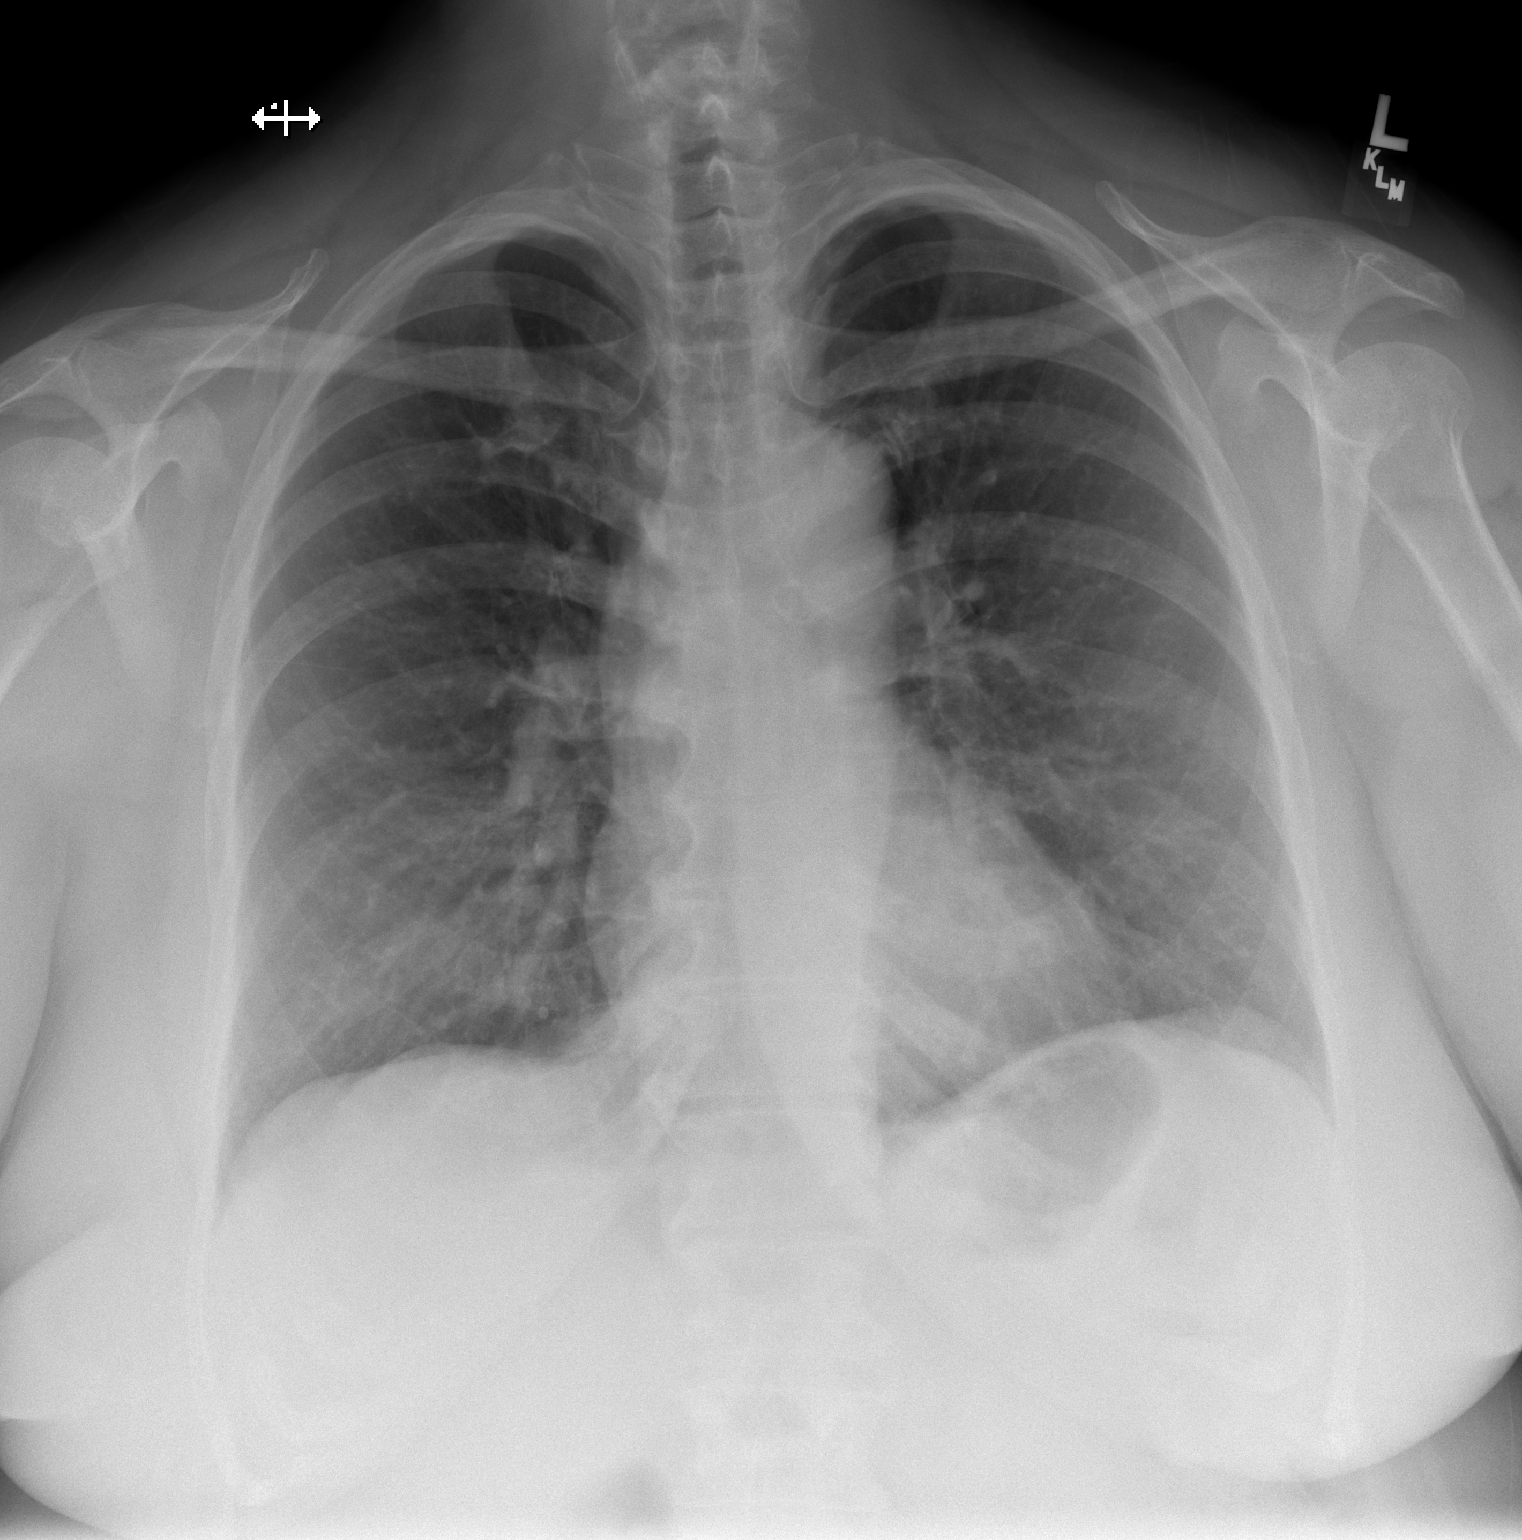

[w chest lat]
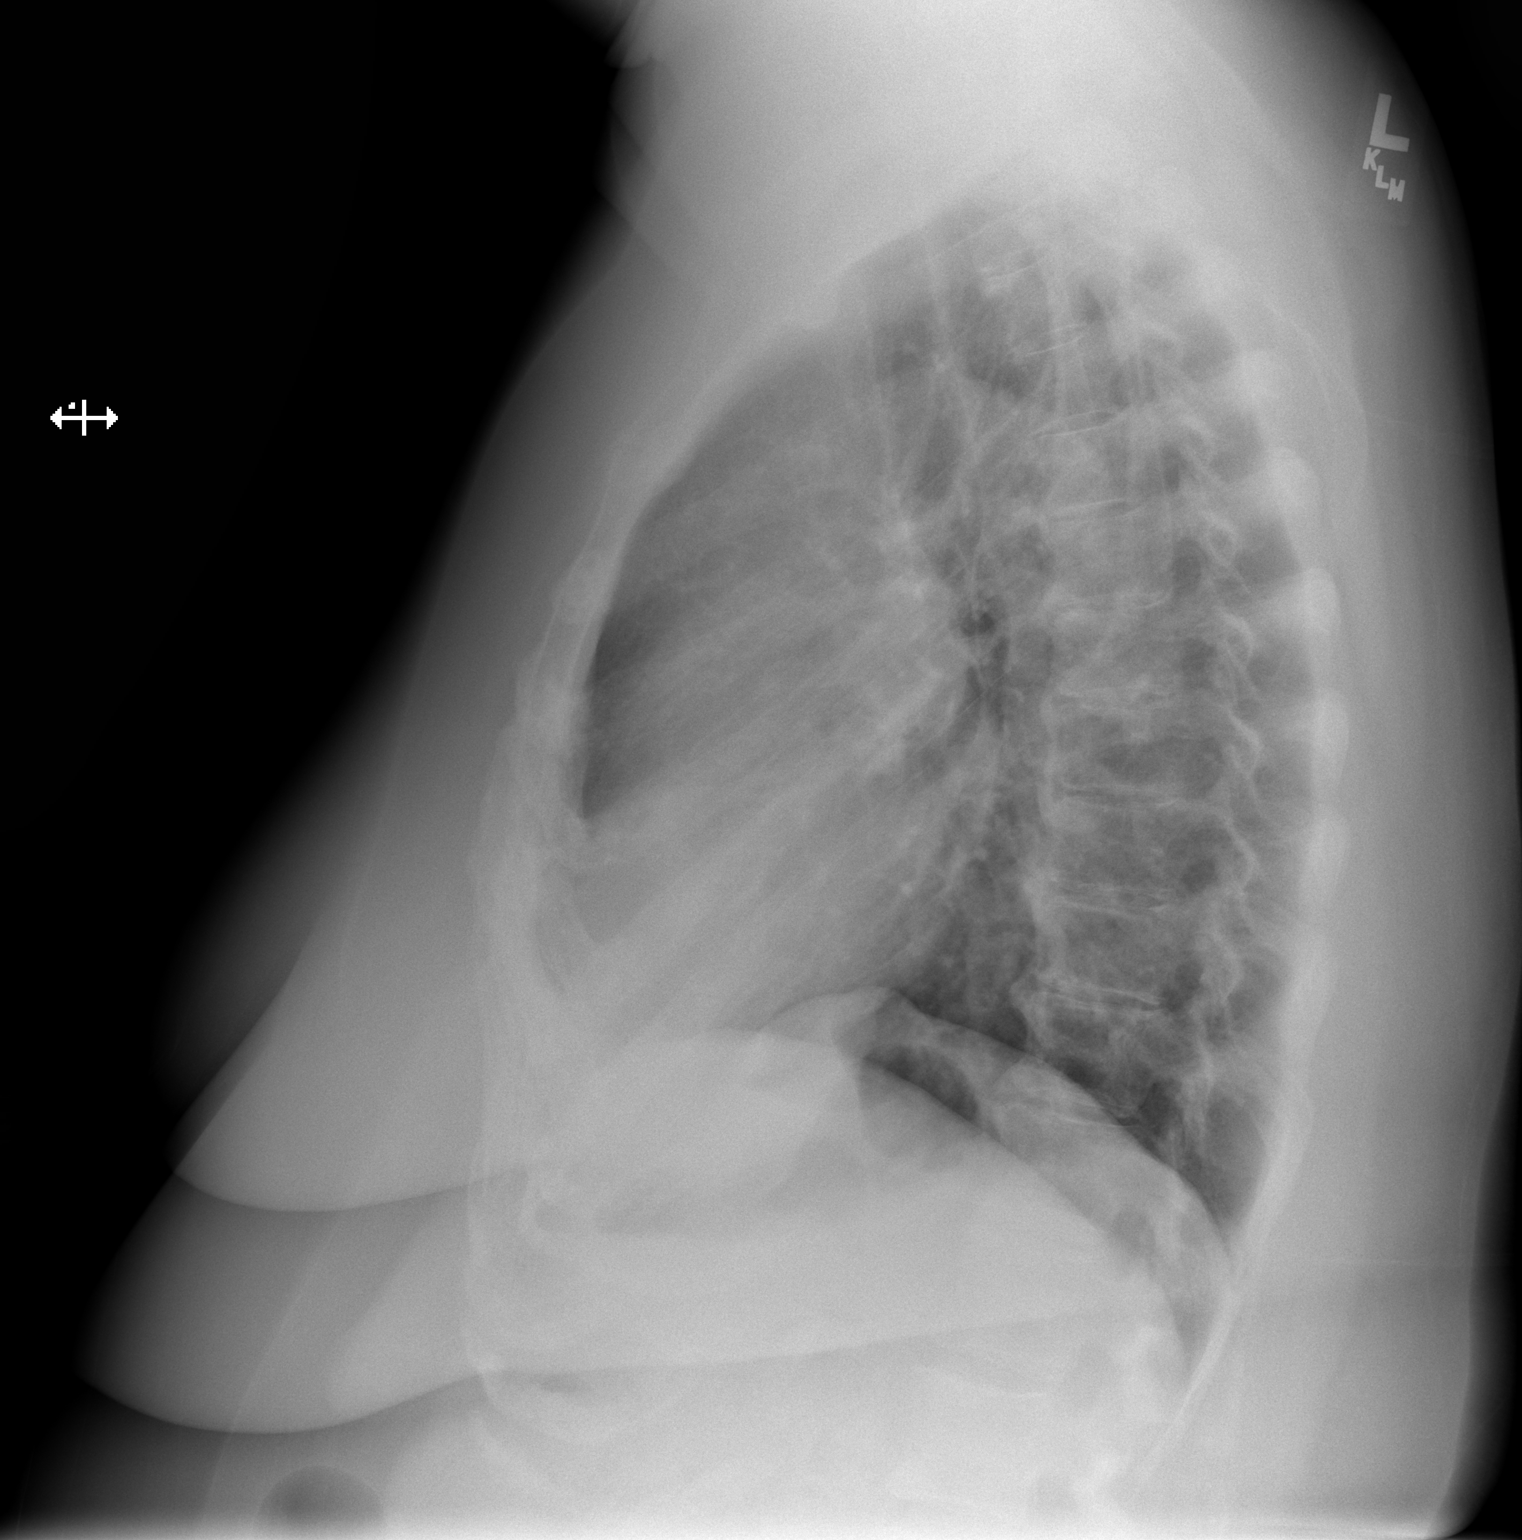

[2 of 2 positions shown; findings below may reference images not displayed]

FINDINGS: Cardiomediastinal silhouette is normal in size and configuration.
Lungs are clear. Lung volumes are normal. No evidence of pneumonia.
No pleural effusion. No pneumothorax seen.

Osseous and soft tissue structures about the chest are unremarkable.
Mild degenerative spurring within the thoracic spine.
IMPRESSION: No active cardiopulmonary disease.

## 2018-05-15 DIAGNOSIS — Z1231 Encounter for screening mammogram for malignant neoplasm of breast: Secondary | ICD-10-CM | POA: Diagnosis not present

## 2018-08-07 ENCOUNTER — Encounter: Payer: Self-pay | Admitting: Family Medicine

## 2018-08-07 DIAGNOSIS — R946 Abnormal results of thyroid function studies: Secondary | ICD-10-CM | POA: Diagnosis not present

## 2018-08-07 DIAGNOSIS — Z23 Encounter for immunization: Secondary | ICD-10-CM | POA: Diagnosis not present

## 2018-08-07 DIAGNOSIS — Z13220 Encounter for screening for lipoid disorders: Secondary | ICD-10-CM | POA: Diagnosis not present

## 2018-08-07 DIAGNOSIS — R609 Edema, unspecified: Secondary | ICD-10-CM | POA: Diagnosis not present

## 2018-08-07 DIAGNOSIS — Z0000 Encounter for general adult medical examination without abnormal findings: Secondary | ICD-10-CM | POA: Diagnosis not present

## 2018-08-22 ENCOUNTER — Encounter (INDEPENDENT_AMBULATORY_CARE_PROVIDER_SITE_OTHER): Payer: Self-pay

## 2018-08-28 ENCOUNTER — Ambulatory Visit (INDEPENDENT_AMBULATORY_CARE_PROVIDER_SITE_OTHER): Payer: BLUE CROSS/BLUE SHIELD | Admitting: Family Medicine

## 2018-08-28 ENCOUNTER — Encounter (INDEPENDENT_AMBULATORY_CARE_PROVIDER_SITE_OTHER): Payer: Self-pay | Admitting: Family Medicine

## 2018-08-28 VITALS — BP 122/81 | HR 77 | Temp 98.1°F | Ht 65.0 in | Wt 319.0 lb

## 2018-08-28 DIAGNOSIS — R0602 Shortness of breath: Secondary | ICD-10-CM | POA: Diagnosis not present

## 2018-08-28 DIAGNOSIS — Z9189 Other specified personal risk factors, not elsewhere classified: Secondary | ICD-10-CM | POA: Diagnosis not present

## 2018-08-28 DIAGNOSIS — E038 Other specified hypothyroidism: Secondary | ICD-10-CM

## 2018-08-28 DIAGNOSIS — Z0289 Encounter for other administrative examinations: Secondary | ICD-10-CM

## 2018-08-28 DIAGNOSIS — Z1331 Encounter for screening for depression: Secondary | ICD-10-CM | POA: Diagnosis not present

## 2018-08-28 DIAGNOSIS — R5383 Other fatigue: Secondary | ICD-10-CM | POA: Insufficient documentation

## 2018-08-28 DIAGNOSIS — R011 Cardiac murmur, unspecified: Secondary | ICD-10-CM | POA: Diagnosis not present

## 2018-08-28 DIAGNOSIS — Z6841 Body Mass Index (BMI) 40.0 and over, adult: Secondary | ICD-10-CM

## 2018-08-29 ENCOUNTER — Telehealth (INDEPENDENT_AMBULATORY_CARE_PROVIDER_SITE_OTHER): Payer: Self-pay

## 2018-08-29 ENCOUNTER — Encounter (INDEPENDENT_AMBULATORY_CARE_PROVIDER_SITE_OTHER): Payer: Self-pay | Admitting: Family Medicine

## 2018-08-29 LAB — COMPREHENSIVE METABOLIC PANEL
ALT: 22 IU/L (ref 0–32)
AST: 21 IU/L (ref 0–40)
Albumin/Globulin Ratio: 1.7 (ref 1.2–2.2)
Albumin: 4.3 g/dL (ref 3.5–5.5)
Alkaline Phosphatase: 62 IU/L (ref 39–117)
BUN/Creatinine Ratio: 25 — ABNORMAL HIGH (ref 9–23)
BUN: 19 mg/dL (ref 6–24)
Bilirubin Total: 0.3 mg/dL (ref 0.0–1.2)
CO2: 27 mmol/L (ref 20–29)
Calcium: 9.3 mg/dL (ref 8.7–10.2)
Chloride: 99 mmol/L (ref 96–106)
Creatinine, Ser: 0.75 mg/dL (ref 0.57–1.00)
GFR calc Af Amer: 105 mL/min/{1.73_m2} (ref >59)
GFR calc non Af Amer: 91 mL/min/{1.73_m2} (ref >59)
Globulin, Total: 2.6 g/dL (ref 1.5–4.5)
Glucose: 85 mg/dL (ref 65–99)
Potassium: 4.5 mmol/L (ref 3.5–5.2)
Sodium: 141 mmol/L (ref 134–144)
Total Protein: 6.9 g/dL (ref 6.0–8.5)

## 2018-08-29 LAB — LIPID PANEL WITH LDL/HDL RATIO
Cholesterol, Total: 177 mg/dL (ref 100–199)
HDL: 54 mg/dL (ref >39)
LDL Calculated: 111 mg/dL — ABNORMAL HIGH (ref 0–99)
LDl/HDL Ratio: 2.1 ratio (ref 0.0–3.2)
Triglycerides: 62 mg/dL (ref 0–149)
VLDL Cholesterol Cal: 12 mg/dL (ref 5–40)

## 2018-08-29 LAB — CBC WITH DIFFERENTIAL
Basophils Absolute: 0.1 10*3/uL (ref 0.0–0.2)
Basos: 1 %
EOS (ABSOLUTE): 0.2 10*3/uL (ref 0.0–0.4)
Eos: 3 %
Hematocrit: 41.3 % (ref 34.0–46.6)
Hemoglobin: 14 g/dL (ref 11.1–15.9)
Immature Grans (Abs): 0 10*3/uL (ref 0.0–0.1)
Immature Granulocytes: 0 %
Lymphocytes Absolute: 1.2 10*3/uL (ref 0.7–3.1)
Lymphs: 20 %
MCH: 27.7 pg (ref 26.6–33.0)
MCHC: 33.9 g/dL (ref 31.5–35.7)
MCV: 82 fL (ref 79–97)
Monocytes Absolute: 0.5 10*3/uL (ref 0.1–0.9)
Monocytes: 8 %
Neutrophils Absolute: 4.1 10*3/uL (ref 1.4–7.0)
Neutrophils: 68 %
RBC: 5.05 x10E6/uL (ref 3.77–5.28)
RDW: 13.9 % (ref 12.3–15.4)
WBC: 6.1 10*3/uL (ref 3.4–10.8)

## 2018-08-29 LAB — T4, FREE: Free T4: 1.29 ng/dL (ref 0.82–1.77)

## 2018-08-29 LAB — HEMOGLOBIN A1C
Est. average glucose Bld gHb Est-mCnc: 108 mg/dL
Hgb A1c MFr Bld: 5.4 % (ref 4.8–5.6)

## 2018-08-29 LAB — T3: T3, Total: 122 ng/dL (ref 71–180)

## 2018-08-29 LAB — TSH: TSH: 2.39 u[IU]/mL (ref 0.450–4.500)

## 2018-08-29 LAB — FOLATE: Folate: 11.1 ng/mL (ref >3.0)

## 2018-08-29 LAB — VITAMIN D 25 HYDROXY (VIT D DEFICIENCY, FRACTURES): Vit D, 25-Hydroxy: 35.2 ng/mL (ref 30.0–100.0)

## 2018-08-29 LAB — VITAMIN B12: Vitamin B-12: 525 pg/mL (ref 232–1245)

## 2018-08-29 LAB — INSULIN, RANDOM: INSULIN: 18.2 u[IU]/mL (ref 2.6–24.9)

## 2018-08-29 NOTE — Progress Notes (Signed)
Office: [phone]  /  Fax: [phone]   Dear Dr. Cliffton Asters,   Thank you for referring [name] to our clinic. The following note includes my evaluation and treatment recommendations.  HPI:   Chief Complaint: OBESITY    Jill Palmer has been referred by Laurann Montana, MD for consultation regarding her obesity and obesity related comorbidities.    [name] (MR# [id number]) is a 52 y.o. female who presents on 08/28/2018 for obesity evaluation and treatment. Current BMI is Body mass index is 53.08 kg/m.[name] [name] has been struggling with her weight for many years and has been unsuccessful in either losing weight, maintaining weight loss, or reaching her healthy weight goal.     [name] attended our information session and states she is currently in the action stage of change and ready to dedicate time achieving and maintaining a healthier weight. [name] is interested in becoming our patient and working on intensive lifestyle modifications including (but not limited to) diet, exercise and weight loss.    [name] states her desired weight loss is 184 lbs she has been heavy most of  her life she started gaining weight around 1999 her heaviest weight ever was 320-323 lbs she is a picky eater and doesn't like to eat healthier foods  she has significant food cravings issues  she snacks frequently in the evenings she is frequently drinking liquids with calories she frequently makes poor food choices she frequently eats larger portions than normal  she struggles with emotional eating    Fatigue [name] feels her energy is lower than it should be. This has worsened with weight gain and has not worsened recently. [name] admits to daytime somnolence and  admits to waking up still tired. Patient is at risk for obstructive sleep apnea. Patent has a history of symptoms of daytime fatigue. Patient generally gets 6 hours of sleep per night, and states they generally have nightime awakenings and  generally restful sleep. Snoring is present. Apneic episodes are not present. Epworth Sleepiness Score is 11.  Dyspnea on exertion Jairy notes increasing shortness of breath with exercising and seems to be worsening over time with weight gain. She notes getting out of breath sooner with activity than she used to. This has not gotten worse recently. Kerrilyn denies orthopnea.  Hypertension [name] is a 52 y.o. female with hypertension. [name]'s blood pressure is stable today on triamterene-hydrochlorothiazide and she denies chest pain. She is working weight loss to help control her blood pressure with the goal of decreasing her risk of heart attack and stroke. [name]'s blood pressure is currently controlled.  Hypothyroidism [name] has a diagnosis of hypothyroidism. She has bee on levothyroxine for 1 month. She states she had some borderline TSH readings. She notes fatigue and tremors, and denies hot or cold intolerance or palpitations.  New Onset Heart Murmur [name] has a new onset heart murmur with significant lower extremity edema, and shortness of breath with exercise. She denies chest pain.  At risk for cardiovascular disease [name] is at a higher than average risk for cardiovascular disease due to obesity, hypertension, hypothyroidism, and heart murmur. She currently denies any chest pain.  Positive Depression Screen [name] has a positive depression screen with significant emotional eating. She is frustrated at her perceived lack of control and uses negative self talk when addressing her weight.  Depression Screen [name]'s Food and Mood (modified PHQ-9) score was  Depression screen PHQ 2/9 08/28/2018  Decreased Interest 2  Down, Depressed, Hopeless 1  PHQ - 2 Score 3  Altered sleeping 0  Tired, decreased energy 1  Change in appetite 2  Feeling bad or failure about yourself  1  Trouble concentrating 1  Moving slowly or fidgety/restless 1  Suicidal thoughts 0  PHQ-9 Score 9    Difficult doing work/chores Not difficult at all    ALLERGIES: Allergies  Allergen Reactions  . Septra [Sulfamethoxazole-Trimethoprim] Rash  . Latex Itching  . Penicillins Itching  . Tessalon Perles [Benzonatate] Other (See Comments)    Muscle weakness   . Meloxicam Rash    MEDICATIONS: Current Outpatient Medications on File Prior to Visit  Medication Sig Dispense Refill  . b complex vitamins tablet Take 1 tablet by mouth daily as needed.    . calcium carbonate (OS-CAL) 600 MG TABS Take 600 mg by mouth every morning.     [name] levothyroxine (SYNTHROID, LEVOTHROID) 50 MCG tablet Take 50 mcg by mouth daily before breakfast.    . Multiple Vitamin (MULTIVITAMIN WITH MINERALS) TABS Take 1 tablet by mouth every morning.     . triamterene-hydrochlorothiazide (MAXZIDE-25) 37.5-25 MG per tablet Take 1 tablet every morning by mouth. For fluid    . vitamin C (ASCORBIC ACID) 500 MG tablet Take 500 mg by mouth daily as needed.     No current facility-administered medications on file prior to visit.     PAST MEDICAL HISTORY: Past Medical History:  Diagnosis Date  . Anemia   . Anxiety   . Back pain   . Bone spur   . Bursitis of right foot   . Chest pain   . Edema   . GERD (gastroesophageal reflux disease)   . Hernia, hiatal   . Hypertension   . Joint pain   . Lymphedema of lower extremity   . Osteoarthritis   . Palpitations   . Soft tissue infection    left leg  . Thyroid disease   . Venous stasis     PAST SURGICAL HISTORY: Past Surgical History:  Procedure Laterality Date  . ABDOMINAL HYSTERECTOMY    . EYE SURGERY    . TONSILLECTOMY      SOCIAL HISTORY: Social History   Tobacco Use  . Smoking status: Never Smoker  . Smokeless tobacco: Never Used  Substance Use Topics  . Alcohol use: No  . Drug use: No    FAMILY HISTORY: Family History  Problem Relation Age of Onset  . Heart failure Mother   . CAD Mother   . CVA Mother   . Hypertension Mother   . Asthma  Mother   . COPD Mother   . Depression Mother   . Paranoid behavior Mother   . Schizophrenia Mother   . Anxiety disorder Mother   . Obesity Mother   . Heart failure Father   . CAD Father   . Bell's palsy Father   . Sudden death Father   . Hypertension Sister   . Edema Sister   . Hypertension Brother   . Asthma Brother   . Colon cancer Maternal Grandmother   . CAD Paternal Grandfather   . Other Brother        MVA  . CAD Paternal Uncle   . Bone cancer Paternal Uncle   . Colon cancer Paternal Uncle   . Breast cancer Other        aunt/82, 1st cousin/40's, 2nd cousin/30  . Rheum arthritis Other        2 aunts, 2 uncles  . Colon polyps Other  1 uncle  . Liver disease Neg Hx     ROS: Review of Systems  Constitutional: Positive for malaise/fatigue. Negative for weight loss.  HENT:       + Nasal stuffiness + Hoarseness  Eyes:       + Vision changes + Wear glasses or contacts + Floaters  Respiratory: Positive for cough and shortness of breath (with exertion).   Cardiovascular: Negative for chest pain, palpitations and orthopnea.  Genitourinary: Positive for frequency (fluid pill).  Musculoskeletal:       + Muscle or joint pain + Red or swollen joints  Skin: Positive for itching.       + Dryness + Hair or nail changes  Neurological: Positive for tremors (hands, somtimes) and headaches.  Endo/Heme/Allergies: Bruises/bleeds easily.       Negative hot/cold intolerance  Psychiatric/Behavioral:       + Stress    PHYSICAL EXAM: Blood pressure 122/81, pulse 77, temperature 98.1 F (36.7 C), temperature source Oral, height 5\' 5"  (1.651 m), weight (!) 319 lb (144.7 kg), SpO2 96 %. Body mass index is 53.08 kg/m. Physical Exam  Constitutional: She is oriented to person, place, and time. She appears well-developed and well-nourished.  HENT:  Head: Normocephalic and atraumatic.  Nose: Nose normal.  Eyes: EOM are normal. No scleral icterus.  Neck: Normal range of  motion. Neck supple. No thyromegaly present.  Cardiovascular: Normal rate and regular rhythm.  Murmur (2/6 early systolic murmur ) heard. Pulmonary/Chest: Effort normal. No respiratory distress.  Abdominal: Soft. There is no tenderness.  + Obesity  Musculoskeletal:  Range of Motion normal in all 4 extremities 3+ edema noted in bilateral lower extremities up to the knees  Neurological: She is alert and oriented to person, place, and time. Coordination normal.  Skin: Skin is warm and dry.  Psychiatric: She has a normal mood and affect. Her behavior is normal.  Vitals reviewed.   RECENT LABS AND TESTS: BMET    Component Value Date/Time   NA 141 08/28/2018 0919   K 4.5 08/28/2018 0919   CL 99 08/28/2018 0919   CO2 27 08/28/2018 0919   GLUCOSE 85 08/28/2018 0919   GLUCOSE 96 02/16/2013 0004   BUN 19 08/28/2018 0919   CREATININE 0.75 08/28/2018 0919   CALCIUM 9.3 08/28/2018 0919   GFRNONAA 91 08/28/2018 0919   GFRAA 105 08/28/2018 0919   Lab Results  Component Value Date   HGBA1C 5.4 08/28/2018   Lab Results  Component Value Date   INSULIN 18.2 08/28/2018   CBC    Component Value Date/Time   WBC 6.1 08/28/2018 0919   WBC 10.1 02/16/2013 0004   RBC 5.05 08/28/2018 0919   RBC 5.35 (H) 02/16/2013 0004   HGB 14.0 08/28/2018 0919   HCT 41.3 08/28/2018 0919   PLT 251 02/16/2013 0004   MCV 82 08/28/2018 0919   MCH 27.7 08/28/2018 0919   MCH 27.3 02/16/2013 0004   MCHC 33.9 08/28/2018 0919   MCHC 33.4 02/16/2013 0004   RDW 13.9 08/28/2018 0919   LYMPHSABS 1.2 08/28/2018 0919   MONOABS 0.8 02/16/2013 0004   EOSABS 0.2 08/28/2018 0919   BASOSABS 0.1 08/28/2018 0919   Iron/TIBC/Ferritin/ %Sat No results found for: IRON, TIBC, FERRITIN, IRONPCTSAT Lipid Panel     Component Value Date/Time   CHOL 177 08/28/2018 0919   TRIG 62 08/28/2018 0919   HDL 54 08/28/2018 0919   LDLCALC 111 (H) 08/28/2018 0919   Hepatic Function Panel  Component Value Date/Time   PROT  6.9 08/28/2018 0919   ALBUMIN 4.3 08/28/2018 0919   AST 21 08/28/2018 0919   ALT 22 08/28/2018 0919   ALKPHOS 62 08/28/2018 0919   BILITOT 0.3 08/28/2018 0919      Component Value Date/Time   TSH 2.390 08/28/2018 0919    ECG  shows NSR with a rate of 82 BPM INDIRECT CALORIMETER done today shows a VO2 of 273 and a REE of 1898.  Her calculated basal metabolic rate is 9811 thus her basal metabolic rate is better than expected.    ASSESSMENT AND PLAN: Other fatigue - Plan: EKG 12-Lead, Vitamin B12, CBC With Differential, Comprehensive metabolic panel, Folate, Hemoglobin A1c, Insulin, random, VITAMIN D 25 Hydroxy (Vit-D Deficiency, Fractures)  Shortness of breath on exertion - Plan: Lipid Panel With LDL/HDL Ratio, ECHOCARDIOGRAM COMPLETE  Other specified hypothyroidism - Plan: T3, T4, free, TSH  Heart murmur - Plan: ECHOCARDIOGRAM COMPLETE  Positive depression screening  At risk for heart disease  Class 3 severe obesity with serious comorbidity and body mass index (BMI) of 50.0 to 59.9 in adult, unspecified obesity type (HCC)  PLAN:  Fatigue Blen was informed that her fatigue may be related to obesity, depression or many other causes. Labs will be ordered, and in the meanwhile Emonii has agreed to work on diet, exercise and weight loss to help with fatigue. Proper sleep hygiene was discussed including the need for 7-8 hours of quality sleep each night. A sleep study was not ordered based on symptoms and Epworth score.  Dyspnea on exertion Amariya's shortness of breath appears to be obesity related and exercise induced. She has agreed to work on weight loss and gradually increase exercise to treat her exercise induced shortness of breath. If Camaria follows our instructions and loses weight without improvement of her shortness of breath, we will plan to refer to pulmonology. We will monitor this condition regularly. Kella agrees to this plan.  Hypertension We discussed sodium  restriction, working on healthy weight loss, and a regular exercise program as the means to achieve improved blood pressure control. Olamide agreed with this plan and agreed to follow up as directed. We will continue to monitor her blood pressure as well as her progress with the above lifestyle modifications. Myanna agrees to continue her medications as prescribed, diet, and weight loss, and will watch for signs of hypotension as she continues her lifestyle modifications. We will check labs and Karmella agrees to follow up with our clinic in 2 weeks.  Hypothyroidism Persephonie was informed of the importance of good thyroid control to help with weight loss efforts. She was also informed that supertheraputic thyroid levels are dangerous and will not improve weight loss results. We will check labs and Letti agrees to follow up with our clinic in 2 weeks.  New Onset Heart Murmur We have sent a referral to [facility] for an echocardiogram. No authorization is required, reference #[id number]. Darlina agrees to follow up with our clinic in 2 weeks.  Cardiovascular risk counselling Idali was given extended (15 minutes) coronary artery disease prevention counseling today. She is 52 y.o. female and has risk factors for heart disease including obesity, hypertension, hypothyroidism, and heart murmur. We discussed intensive lifestyle modifications today with an emphasis on specific weight loss instructions and strategies. Pt was also informed of the importance of increasing exercise and decreasing saturated fats to help prevent heart disease.  Positive Depression screen We will refer to Dr. Dewaine Conger, our bariatric  psychologist for evaluation. Annais agrees to follow up with our clinic in 2 weeks with myself and with Dr. Dewaine Conger.  Depression Screen Diasha had a mildly positive depression screening. Depression is commonly associated with obesity and often results in emotional eating behaviors. We will monitor this  closely and work on CBT to help improve the non-hunger eating patterns. Referral to Psychology may be required if no improvement is seen as she continues in our clinic.  Obesity Margerite is currently in the action stage of change and her goal is to continue with weight loss efforts. I recommend Afsa begin the structured treatment plan as follows:  She has agreed to follow the Category 3 plan Laurie has been instructed to eventually work up to a goal of 150 minutes of combined cardio and strengthening exercise per week for weight loss and overall health benefits. We discussed the following Behavioral Modification Strategies today: increasing lean protein intake, decreasing simple carbohydrates, increase H20 intake, decreasing sodium intake, decrease eating out, and work on meal planning and easy cooking plans   She was informed of the importance of frequent follow up visits to maximize her success with intensive lifestyle modifications for her multiple health conditions. She was informed we would discuss her lab results at her next visit unless there is a critical issue that needs to be addressed sooner. Angy agreed to keep her next visit at the agreed upon time to discuss these results.    OBESITY BEHAVIORAL INTERVENTION VISIT  Today's visit was # 1   Starting weight: 319 lbs Starting date: 08/28/18 Today's weight : 319 lbs  Today's date: 08/28/2018 Total lbs lost to date: 0    ASK: We discussed the diagnosis of obesity with Ronald Pippins today and Dayannara agreed to give [place] permission to discuss obesity behavioral modification therapy today.  ASSESS: Caily has the diagnosis of obesity and her BMI today is 53.08 Pieper is in the action stage of change   ADVISE: Addalynne was educated on the multiple health risks of obesity as well as the benefit of weight loss to improve her health. She was advised of the need for long term treatment and the importance of lifestyle modifications to improve  her current health and to decrease her risk of future health problems.  AGREE: Multiple dietary modification options and treatment options were discussed and  Iman agreed to follow the recommendations documented in the above note.  ARRANGE: Nailea was educated on the importance of frequent visits to treat obesity as outlined per CMS and USPSTF guidelines and agreed to schedule her next follow up appointment today.  I, Burt Knack, am acting as transcriptionist for Quillian Quince, MD   I have reviewed the above documentation for accuracy and completeness, and I agree with the above. -Quillian Quince, MD

## 2018-08-29 NOTE — Telephone Encounter (Signed)
Patient called in stating her arm was numb after having blood drawn at our office yesterday and has gotten worse overnight. She denies any pain, swelling or redness. Informed the patient if her symptoms worsen to be seen at the emergency room. Patient verbalized understanding.    Called patient back to check to see if she has gained any feeling in arm or if she had to go to the ED but had to leave the patient  A message.    Artis Beggs, CMA

## 2018-09-06 ENCOUNTER — Ambulatory Visit (HOSPITAL_COMMUNITY): Payer: BLUE CROSS/BLUE SHIELD | Attending: Internal Medicine

## 2018-09-06 ENCOUNTER — Other Ambulatory Visit: Payer: Self-pay

## 2018-09-06 DIAGNOSIS — R011 Cardiac murmur, unspecified: Secondary | ICD-10-CM | POA: Insufficient documentation

## 2018-09-06 DIAGNOSIS — R0602 Shortness of breath: Secondary | ICD-10-CM | POA: Insufficient documentation

## 2018-09-10 ENCOUNTER — Ambulatory Visit (INDEPENDENT_AMBULATORY_CARE_PROVIDER_SITE_OTHER): Payer: BLUE CROSS/BLUE SHIELD | Admitting: Psychology

## 2018-09-10 DIAGNOSIS — F4322 Adjustment disorder with anxiety: Secondary | ICD-10-CM

## 2018-09-10 NOTE — Progress Notes (Signed)
Office: [phone]  /  Fax: [phone]  Date: September 10, 2018 Time Seen: 11:00am Duration: 58 minutes Provider: Glennie Isle, PsyD Type of Session: Intake for Individual Therapy   Informed Consent:The provider's role was explained to Jill Palmer. The provider reviewed and discussed issues of confidentiality, privacy, and limits therein. In addition to verbal informed consent, written informed consent for psychological services was obtained from Jill Palmer prior to the initial intake interview. Written consent included information concerning the practice, financial arrangements, and confidentiality and patients' rights. Since the clinic is not a 24/7 crisis center, mental health emergency resources were shared and a handout was provided. The provider further explained the utilization of MyChart, e-mail, voicemail, and/or other messaging systems can be utilized for non-emergency reasons. Jill Palmer verbally acknowledged understanding of the aforementioned, and agreed to use mental health emergency resources discussed if needed. Moreover, Jill Palmer agreed information may be shared with other [facility] providers as needed for coordination of care, and written consent was obtained.   Chief Complaint: Jill Palmer was referred by Dr. Dennard Palmer due to a positive depression screen and emotional eating. Per the note for the initial visit with Dr. Dennard Palmer on August 28, 2018, "Jill Palmer has a positive depression screen with significant emotional eating. She is frustrated at her perceived lack of control and uses negative self talk when addressing her weight."   During today's appointment, Jill Palmer reported her last episode of emotional eating was prior to starting with this clinic during which she consumed a "seasonal Frappuccino" from [place]. She stated she craves sweet and salty foods. Regarding the prescribed meal plan, Jill Palmer stated it is going "pretty good;" however, she noted  difficulty eating her dinner on Thursday and Friday of last week due to nausea. She also acknowledged she did not eat all the food for dinner last night.    Jill Palmer was asked to complete a questionnaire assessing various behaviors related to emotional eating. Jill Palmer endorsed the following: overeat when you are celebrating, eat certain foods when you are anxious, stressed, depressed, or your feelings are hurt, use food to help you cope with emotional situations, find food is comforting to you and overeat when you are worried about something.  HPI: Per the note for the initial visit with Dr. Dennard Palmer on August 28, 2018, Jill Palmer has been heavy most of her life, and she started gaining weight around 1999. Her heaviest weight ever was 320-323 pounds. During the initial appointment with Dr. Leafy Palmer, Jill Palmer reported experiencing the following: significant food cravings issues;  snacking frequently in the evenings; frequently drinking liquids with calories; frequently making poor food choices; frequently eating larger portions than normal; struggling with emotional eating. Additionally, she described herself as a picky eater and she does not like to eat healthier foods. During today's appointment, Jill Palmer reported a belief that the onset of emotional eating was after college. She denied a history of binge eating and overeating. Jill Palmer also denied ever being diagnosed with an eating disorder, as well as a history of purging and engagement in other compensatory strategies.   Mental Status Examination: Jill Palmer arrived on time for the appointment. She presented as appropriately dressed and groomed. Jill Palmer appeared her stated age and demonstrated adequate orientation to time, place, person, and purpose of the appointment. She also demonstrated appropriate eye contact. No psychomotor abnormalities or behavioral peculiarities noted. Her mood was euthymic with congruent affect. Her thought processes were logical, linear, and  goal-directed. No hallucinations, delusions, bizarre thinking  or behavior reported or observed. Judgment, insight, and impulse control appeared to be grossly intact. There was no evidence of paraphasias (i.e., errors in speech, gross mispronunciations, and word substitutions), repetition deficits, or disturbances in volume or prosody (i.e., rhythm and intonation). There was no evidence of attention or memory impairments. Jill Palmer denied current suicidal and homicidal ideation, plan, and intent.   The Mini-Mental State Examination, Second Edition (MMSE-2) was administered. The MMSE-2 briefly screens for cognitive dysfunction and overall mental status and assesses different cognitive domains: orientation, registration, attention and calculation, recall, and language and praxis. Jill Palmer received 29 out of 30 points possible on the MMSE-2, which is noted in the normal range. A point was lost on the delayed recall task, as Jill Palmer recalled two out of three words after a short delay.   Family & Psychosocial History: Jill Palmer reported she is not currently in a relationship and does not have any children; however, she shared she has two cats. Jill Palmer described recently moving and starting in her new position, which she is still adjusting to. More specifically, she is currently employed with [employer] as a Geophysicist/field seismologist and noted she has been with the company for approximately six years. Jill Palmer shared her highest level of education obtained is a Manufacturing engineer of arts degree. She indicated her social support system consists of two really good coworkers, friends, sister, and brother. Jill Palmer shared she attends [employer] and is involved in volunteering. 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  Medical History:  Past Medical History:  Diagnosis Date  . Anemia   . Anxiety   . Back pain   . Bone spur   . Bursitis of right foot   . Chest pain   . Edema   . GERD (gastroesophageal reflux disease)   . Hernia, hiatal     . Hypertension   . Joint pain   . Lymphedema of lower extremity   . Osteoarthritis   . Palpitations   . Soft tissue infection    left leg  . Thyroid disease   . Venous stasis    Past Surgical History:  Procedure Laterality Date  . ABDOMINAL HYSTERECTOMY    . EYE SURGERY    . TONSILLECTOMY     Current Outpatient Medications on File Prior to Visit  Medication Sig Dispense Refill  . b complex vitamins tablet Take 1 tablet by mouth daily as needed.    . calcium carbonate (OS-CAL) 600 MG TABS Take 600 mg by mouth every morning.     [name] levothyroxine (SYNTHROID, LEVOTHROID) 50 MCG tablet Take 50 mcg by mouth daily before breakfast.    . Multiple Vitamin (MULTIVITAMIN WITH MINERALS) TABS Take 1 tablet by mouth every morning.     . triamterene-hydrochlorothiazide (MAXZIDE-25) 37.5-25 MG per tablet Take 1 tablet every morning by mouth. For fluid    . vitamin C (ASCORBIC ACID) 500 MG tablet Take 500 mg by mouth daily as needed.     No current facility-administered medications on file prior to visit.   Trayonna denied a history of head injuries and loss of consciousness.   Mental Health History: Jill Palmer first received therapeutic services around 2006 or 2007 due to job stress for approximately three appointments. She denied a history of hospitalizations for psychiatric concerns and has never met with a psychiatrist.  Regarding psychotropic medications, Jill Palmer shared that around 1999 and 2000, she was prescribed Zoloft by her primary care physician. Her family history for mental health concerns is significant for her mother  being diagnosed with paranoid schizophrenia and depression. Isa denied a trauma history, including sexual, physical, and psychological abuse as well as neglect.  Taleen reported experiencing the following: nervousness and stress due to her job. Her strengths include being a helper, volunteering, and being organized.   Danique denied experiencing the following: obsessions and  compulsions, mania, hallucinations and delusions, history of and current engagement in self-harm, angry outbursts, history of and current homicidal ideation, plan, and intent, history of and current suicidal ideation, plan, and intent, memory concerns, attention and concentration issues, appetite issues, sleeping difficulties, hopelessness and worry thoughts. Mariah also denied experiencing fatigue and anhedonia. She denied substance use, but noted occasional alcohol use. The last time she consumed alcohol was approximately one year ago.   Structured Assessment Results: The Patient Health Questionnaire-9 (PHQ-9) is a self-report measure that assesses symptoms and severity of depression over the course of the last two weeks. Aniyla obtained a score of two suggesting minimal depression. Sahian finds the endorsed symptoms to be not difficult at all. Depression screen [facility] 2/9 09/10/2018  Decreased Interest 0  Down, Depressed, Hopeless 0  PHQ - 2 Score 0  Altered sleeping 0  Tired, decreased energy 1  Change in appetite 1  Feeling bad or failure about yourself  0  Trouble concentrating 0  Moving slowly or fidgety/restless 0  Suicidal thoughts 0  PHQ-9 Score 2  Difficult doing work/chores -   The Generalized Anxiety Disorder-7 (GAD-7) is a brief self-report measure that assesses symptoms of anxiety over the course of the last two weeks. Layani obtained a score of one suggesting minimal anxiety. GAD 7 : Generalized Anxiety Score 09/10/2018  Nervous, Anxious, on Edge 1  Control/stop worrying 0  Worry too much - different things 0  Trouble relaxing 0  Restless 0  Easily annoyed or irritable 0  Afraid - awful might happen 0  Total GAD 7 Score 1  Anxiety Difficulty Not difficult at all   Interventions: A chart review was conducted prior to the clinical intake interview. The MMSE-2, PHQ-9, and GAD-7 were administered and a clinical intake interview was completed. In addition, Aoi was asked to complete  a Mood and Food questionnaire to assess various behaviors related to emotional eating. Throughout session, empathic reflections and validation was provided. Continuing treatment with this provider was discussed and a treatment goal was established. Psychoeducation regarding emotional versus physical hunger was provided. Noble was given a handout to utilize between now and the next appointment to increase awareness of hunger patterns and subsequent eating.   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 She appears able and willing to participate as evidenced by collaboration on a treatment goal, engagement in reciprocal conversation, and asking questions as needed for clarification. The next appointment will be scheduled in three weeks due to financial concerns. The following treatment goal was established: decrease emotional eating.

## 2018-09-11 ENCOUNTER — Ambulatory Visit (INDEPENDENT_AMBULATORY_CARE_PROVIDER_SITE_OTHER): Payer: BLUE CROSS/BLUE SHIELD | Admitting: Family Medicine

## 2018-09-11 VITALS — BP 135/88 | HR 81 | Ht 65.0 in | Wt 308.0 lb

## 2018-09-11 DIAGNOSIS — E7800 Pure hypercholesterolemia, unspecified: Secondary | ICD-10-CM

## 2018-09-11 DIAGNOSIS — Z9189 Other specified personal risk factors, not elsewhere classified: Secondary | ICD-10-CM

## 2018-09-11 DIAGNOSIS — E88819 Insulin resistance, unspecified: Secondary | ICD-10-CM

## 2018-09-11 DIAGNOSIS — E559 Vitamin D deficiency, unspecified: Secondary | ICD-10-CM | POA: Diagnosis not present

## 2018-09-11 DIAGNOSIS — E8881 Metabolic syndrome: Secondary | ICD-10-CM | POA: Diagnosis not present

## 2018-09-11 DIAGNOSIS — Z6841 Body Mass Index (BMI) 40.0 and over, adult: Secondary | ICD-10-CM

## 2018-09-11 MED ORDER — VITAMIN D (ERGOCALCIFEROL) 1.25 MG (50000 UNIT) PO CAPS: 50000.0000 [IU] | ORAL_CAPSULE | ORAL | 0 refills | Status: DC

## 2018-09-16 NOTE — Progress Notes (Signed)
Office: [phone]  /  Fax: [phone]   HPI:   Chief Complaint: OBESITY Jill Palmer is here to discuss her progress with her obesity treatment plan. She is on the  follow the Category 3 plan and is following her eating plan approximately 90 % of the time. She states she is exercising 0 minutes 0 times per week. Jill Palmer did well with weight loss on her Category 3 plan. She struggled to eat all her dinner at times.  Her weight is (!) 308 lb (139.7 kg) today and has had a weight loss of 11 pounds over a period of 2 weeks since her last visit. She has lost 11 lbs since starting treatment with [place].  Hyperlipidemia (Isolated) Jill Palmer has hyperlipidemia with a elevated LDL and has been trying to improve her cholesterol levels with intensive lifestyle modification including a low saturated fat diet, exercise and weight loss. She denies any chest pain, claudication or myalgias.  Vitamin D deficiency Jill Palmer has a diagnosis of vitamin D deficiency. She is currently taking OTC vit D but not yet at goal and denies nausea, vomiting or muscle weakness.  Ref. Range 08/28/2018 09:19  Vitamin D, 25-Hydroxy Latest Ref Range: 30.0 - 100.0 ng/mL 35.2   Insulin Resistance Jill Palmer has a diagnosis of insulin resistance based on her elevated fasting insulin level >5. Although Jill Palmer's blood glucose readings are still under good control, insulin resistance puts her at greater risk of metabolic syndrome and diabetes. She is not taking metformin currently and continues to work on diet and exercise to decrease risk of diabetes. She reports polyphagia but this has improved with decrease in simple carbohydrates.   At risk for diabetes Jill Palmer is at higher than averagerisk for developing diabetes due to her obesity. She currently denies polyuria or polydipsia.  ALLERGIES: Allergies  Allergen Reactions  . Septra [Sulfamethoxazole-Trimethoprim] Rash  . Latex Itching  . Penicillins Itching  . Tessalon Perles [Benzonatate] Other  (See Comments)    Muscle weakness   . Meloxicam Rash    MEDICATIONS: Current Outpatient Medications on File Prior to Visit  Medication Sig Dispense Refill  . b complex vitamins tablet Take 1 tablet by mouth daily as needed.    . calcium carbonate (OS-CAL) 600 MG TABS Take 600 mg by mouth every morning.     . Multiple Vitamin (MULTIVITAMIN WITH MINERALS) TABS Take 1 tablet by mouth every morning.     . triamterene-hydrochlorothiazide (MAXZIDE-25) 37.5-25 MG per tablet Take 1 tablet every morning by mouth. For fluid    . vitamin C (ASCORBIC ACID) 500 MG tablet Take 500 mg by mouth daily as needed.     No current facility-administered medications on file prior to visit.     PAST MEDICAL HISTORY: Past Medical History:  Diagnosis Date  . Anemia   . Anxiety   . Back pain   . Bone spur   . Bursitis of right foot   . Chest pain   . Edema   . GERD (gastroesophageal reflux disease)   . Hernia, hiatal   . Hypertension   . Joint pain   . Lymphedema of lower extremity   . Osteoarthritis   . Palpitations   . Soft tissue infection    left leg  . Thyroid disease   . Venous stasis     PAST SURGICAL HISTORY: Past Surgical History:  Procedure Laterality Date  . ABDOMINAL HYSTERECTOMY    . EYE SURGERY    . TONSILLECTOMY      SOCIAL HISTORY:  Social History   Tobacco Use  . Smoking status: Never Smoker  . Smokeless tobacco: Never Used  Substance Use Topics  . Alcohol use: No  . Drug use: No    FAMILY HISTORY: Family History  Problem Relation Age of Onset  . Heart failure Mother   . CAD Mother   . CVA Mother   . Hypertension Mother   . Asthma Mother   . COPD Mother   . Depression Mother   . Paranoid behavior Mother   . Schizophrenia Mother   . Anxiety disorder Mother   . Obesity Mother   . Heart failure Father   . CAD Father   . Bell's palsy Father   . Sudden death Father   . Hypertension Sister   . Edema Sister   . Hypertension Brother   . Asthma Brother     . Colon cancer Maternal Grandmother   . CAD Paternal Grandfather   . Other Brother        MVA  . CAD Paternal Uncle   . Bone cancer Paternal Uncle   . Colon cancer Paternal Uncle   . Breast cancer Other        aunt/82, 1st cousin/40's, 2nd cousin/30  . Rheum arthritis Other        2 aunts, 2 uncles  . Colon polyps Other        1 uncle  . Liver disease Neg Hx     ROS: Review of Systems  Constitutional: Positive for weight loss.  Cardiovascular: Negative for chest pain and claudication.  Gastrointestinal: Negative for nausea and vomiting.  Musculoskeletal: Negative for myalgias.       Negative for muscle weakness  Endo/Heme/Allergies: Negative for polydipsia.       Positive for polyphagia  Negative for polyuria    PHYSICAL EXAM: Blood pressure 135/88, pulse 81, height 5\' 5"  (1.651 m), weight (!) 308 lb (139.7 kg), SpO2 99 %. Body mass index is 51.25 kg/m. Physical Exam  Constitutional: She is oriented to person, place, and time. She appears well-developed and well-nourished.  HENT:  Head: Normocephalic.  Neck: Normal range of motion.  Cardiovascular: Normal rate.  Pulmonary/Chest: Effort normal.  Musculoskeletal: Normal range of motion.  Neurological: She is alert and oriented to person, place, and time.  Skin: Skin is warm and dry.  Psychiatric: She has a normal mood and affect. Her behavior is normal.  Vitals reviewed.   RECENT LABS AND TESTS: BMET    Component Value Date/Time   NA 141 08/28/2018 0919   K 4.5 08/28/2018 0919   CL 99 08/28/2018 0919   CO2 27 08/28/2018 0919   GLUCOSE 85 08/28/2018 0919   GLUCOSE 96 02/16/2013 0004   BUN 19 08/28/2018 0919   CREATININE 0.75 08/28/2018 0919   CALCIUM 9.3 08/28/2018 0919   GFRNONAA 91 08/28/2018 0919   GFRAA 105 08/28/2018 0919   Lab Results  Component Value Date   HGBA1C 5.4 08/28/2018   Lab Results  Component Value Date   INSULIN 18.2 08/28/2018   CBC    Component Value Date/Time   WBC 6.1  08/28/2018 0919   WBC 10.1 02/16/2013 0004   RBC 5.05 08/28/2018 0919   RBC 5.35 (H) 02/16/2013 0004   HGB 14.0 08/28/2018 0919   HCT 41.3 08/28/2018 0919   PLT 251 02/16/2013 0004   MCV 82 08/28/2018 0919   MCH 27.7 08/28/2018 0919   MCH 27.3 02/16/2013 0004   MCHC 33.9 08/28/2018 0919   MCHC  33.4 02/16/2013 0004   RDW 13.9 08/28/2018 0919   LYMPHSABS 1.2 08/28/2018 0919   MONOABS 0.8 02/16/2013 0004   EOSABS 0.2 08/28/2018 0919   BASOSABS 0.1 08/28/2018 0919   Iron/TIBC/Ferritin/ %Sat No results found for: IRON, TIBC, FERRITIN, IRONPCTSAT Lipid Panel     Component Value Date/Time   CHOL 177 08/28/2018 0919   TRIG 62 08/28/2018 0919   HDL 54 08/28/2018 0919   LDLCALC 111 (H) 08/28/2018 0919   Hepatic Function Panel     Component Value Date/Time   PROT 6.9 08/28/2018 0919   ALBUMIN 4.3 08/28/2018 0919   AST 21 08/28/2018 0919   ALT 22 08/28/2018 0919   ALKPHOS 62 08/28/2018 0919   BILITOT 0.3 08/28/2018 0919      Component Value Date/Time   TSH 2.390 08/28/2018 0919    Ref. Range 08/28/2018 09:19  Vitamin D, 25-Hydroxy Latest Ref Range: 30.0 - 100.0 ng/mL 35.2    ASSESSMENT AND PLAN: Hyperlipidemia, unspecified hyperlipidemia type  Vitamin D deficiency - Plan: Vitamin D, Ergocalciferol, (DRISDOL) 1.25 MG (50000 UT) CAPS capsule  Insulin resistance  At risk for diabetes mellitus  Class 3 severe obesity with serious comorbidity and body mass index (BMI) of 50.0 to 59.9 in adult, unspecified obesity type (HCC)  PLAN: Hyperlipidemia  Jill Palmer was informed of the American Heart Association Guidelines emphasizing intensive lifestyle modifications as the first line treatment for hyperlipidemia. We discussed many lifestyle modifications today in depth, and Aydee will continue to work on decreasing saturated fats such as fatty red meat, butter and many fried foods. She will also increase vegetables and lean protein in her diet and continue to work on exercise and weight  loss efforts. We will repeat in 3 months.   Vitamin D Deficiency Jill Palmer was informed that low vitamin D levels contributes to fatigue and are associated with obesity, breast, and colon cancer. She agrees to start to take prescription Vit D @50 ,000 IU every week #4 with no refills and will follow up for routine testing of vitamin D, at least 2-3 times per year. She was informed of the risk of over-replacement of vitamin D and agrees to not increase her dose unless she discusses this with [place] first. Agrees to follow up with our clinic as directed.   Insulin Resistance Jill Palmer will continue to work on weight loss, exercise, and decreasing simple carbohydrates in her diet to help decrease the risk of diabetes. We dicussed metformin including benefits and risks. She was informed that eating too many simple carbohydrates or too many calories at one sitting increases the likelihood of GI side effects. Jill Palmer declined metformin for now and prescription was not written today. Jill Palmer agreed to follow up with [place] as directed to monitor her progress.  Diabetes risk counselling Jill Palmer was given extended (30 minutes) diabetes prevention counseling today. She is 52 y.o. female and has risk factors for diabetes including obesity. We discussed intensive lifestyle modifications today with an emphasis on weight loss as well as increasing exercise and decreasing simple carbohydrates in her diet.  Obesity Jill Palmer is currently in the action stage of change. As such, her goal is to continue with weight loss efforts She has agreed to follow the Category 3 plan Jill Palmer has been instructed to work up to a goal of 150 minutes of combined cardio and strengthening exercise per week for weight loss and overall health benefits. We discussed the following Behavioral Modification Strategies today: increasing lean protein intake, no skipping meals, and decreasing simple carbohydrates.  Jill Palmer has agreed to follow up with our clinic in 2  weeks. She was informed of the importance of frequent follow up visits to maximize her success with intensive lifestyle modifications for her multiple health conditions.   OBESITY BEHAVIORAL INTERVENTION VISIT  Today's visit was # 2   Starting weight: 319 lb Starting date: 08/28/18 Today's weight : Weight: (!) 308 lb (139.7 kg)  Today's date: 09/11/18 Total lbs lost to date: 11 lb   ASK: We discussed the diagnosis of obesity with Jill Palmer today and Jill Palmer agreed to give us permission to discuss obesity behavioral modification therapy today.  ASSESS: Jill Palmer has the diagnosis of obesity and her BMI today is 4051.25 Jill Palmer is in the action stage of change   ADVISE: Jill Palmer was educated on the multiple health risks of obesity as well as the benefit of weight loss to improve her health. She was advised of the need for long term treatment and the importance of lifestyle modifications to improve her current health and to decrease her risk of future health problems.  AGREE: Multiple dietary modification options and treatment options were discussed and  Jill Palmer agreed to follow the recommendations documented in the above note.  ARRANGE: Jill Palmer was educated on the importance of frequent visits to treat obesity as outlined per CMS and USPSTF guidelines and agreed to schedule her next follow up appointment today.  I, Jeralene PetersAshleigh Haynes, am acting as transcriptionist for Quillian Quincearen Terrance Usery, MD   I have reviewed the above documentation for accuracy and completeness, and I agree with the above. -Quillian Quincearen Candido Flott, MD

## 2018-09-25 ENCOUNTER — Encounter (INDEPENDENT_AMBULATORY_CARE_PROVIDER_SITE_OTHER): Payer: Self-pay | Admitting: Family Medicine

## 2018-09-25 ENCOUNTER — Ambulatory Visit (INDEPENDENT_AMBULATORY_CARE_PROVIDER_SITE_OTHER): Payer: BLUE CROSS/BLUE SHIELD | Admitting: Family Medicine

## 2018-09-25 VITALS — BP 130/90 | HR 91 | Temp 98.2°F | Ht 65.0 in | Wt 304.0 lb

## 2018-09-25 DIAGNOSIS — Z6841 Body Mass Index (BMI) 40.0 and over, adult: Secondary | ICD-10-CM

## 2018-09-25 DIAGNOSIS — E559 Vitamin D deficiency, unspecified: Secondary | ICD-10-CM

## 2018-09-30 ENCOUNTER — Other Ambulatory Visit (INDEPENDENT_AMBULATORY_CARE_PROVIDER_SITE_OTHER): Payer: Self-pay | Admitting: Family Medicine

## 2018-09-30 DIAGNOSIS — L814 Other melanin hyperpigmentation: Secondary | ICD-10-CM | POA: Diagnosis not present

## 2018-09-30 DIAGNOSIS — L570 Actinic keratosis: Secondary | ICD-10-CM | POA: Diagnosis not present

## 2018-09-30 DIAGNOSIS — L821 Other seborrheic keratosis: Secondary | ICD-10-CM | POA: Diagnosis not present

## 2018-09-30 DIAGNOSIS — D229 Melanocytic nevi, unspecified: Secondary | ICD-10-CM | POA: Diagnosis not present

## 2018-09-30 DIAGNOSIS — D1801 Hemangioma of skin and subcutaneous tissue: Secondary | ICD-10-CM | POA: Diagnosis not present

## 2018-09-30 DIAGNOSIS — E559 Vitamin D deficiency, unspecified: Secondary | ICD-10-CM

## 2018-10-01 ENCOUNTER — Encounter (INDEPENDENT_AMBULATORY_CARE_PROVIDER_SITE_OTHER): Payer: Self-pay | Admitting: Family Medicine

## 2018-10-01 NOTE — Progress Notes (Signed)
Office: [phone]  /  Fax: [phone]   HPI:   Chief Complaint: OBESITY Jill Palmer is here to discuss her progress with her obesity treatment plan. She is on the  follow the Category 3 plan and is following her eating plan approximately 96 % of the time. She states she is exercising 0 minutes 0 times per week. Jill Palmer denies excessive cravings. She is bored with food choices.   Her weight is (!) 304 lb (137.9 kg) today and has had a weight loss of 4 pounds over a period of 2 weeks since her last visit. She has lost 15 lbs since starting treatment with [place].  Vitamin D deficiency Jill Palmer has a diagnosis of vitamin D deficiency. She is currently taking vit D and denies nausea, vomiting or muscle weakness. Vit D is not at goal.   Ref. Range 08/28/2018 09:19  Vitamin D, 25-Hydroxy Latest Ref Range: 30.0 - 100.0 ng/mL 35.2   ALLERGIES: Allergies  Allergen Reactions  . Septra [Sulfamethoxazole-Trimethoprim] Rash  . Latex Itching  . Penicillins Itching  . Tessalon Perles [Benzonatate] Other (See Comments)    Muscle weakness   . Meloxicam Rash    MEDICATIONS: Current Outpatient Medications on File Prior to Visit  Medication Sig Dispense Refill  . b complex vitamins tablet Take 1 tablet by mouth daily as needed.    . calcium carbonate (OS-CAL) 600 MG TABS Take 600 mg by mouth every morning.     . Multiple Vitamin (MULTIVITAMIN WITH MINERALS) TABS Take 1 tablet by mouth every morning.     . triamterene-hydrochlorothiazide (MAXZIDE-25) 37.5-25 MG per tablet Take 1 tablet every morning by mouth. For fluid    . vitamin C (ASCORBIC ACID) 500 MG tablet Take 500 mg by mouth daily as needed.    . Vitamin D, Ergocalciferol, (DRISDOL) 1.25 MG (50000 UT) CAPS capsule Take 1 capsule (50,000 Units total) by mouth every 7 (seven) days. 4 capsule 0   No current facility-administered medications on file prior to visit.     PAST MEDICAL HISTORY: Past Medical History:  Diagnosis Date  . Anemia   .  Anxiety   . Back pain   . Bone spur   . Bursitis of right foot   . Chest pain   . Edema   . GERD (gastroesophageal reflux disease)   . Hernia, hiatal   . Hypertension   . Joint pain   . Lymphedema of lower extremity   . Osteoarthritis   . Palpitations   . Soft tissue infection    left leg  . Thyroid disease   . Venous stasis     PAST SURGICAL HISTORY: Past Surgical History:  Procedure Laterality Date  . ABDOMINAL HYSTERECTOMY    . EYE SURGERY    . TONSILLECTOMY      SOCIAL HISTORY: Social History   Tobacco Use  . Smoking status: Never Smoker  . Smokeless tobacco: Never Used  Substance Use Topics  . Alcohol use: No  . Drug use: No    FAMILY HISTORY: Family History  Problem Relation Age of Onset  . Heart failure Mother   . CAD Mother   . CVA Mother   . Hypertension Mother   . Asthma Mother   . COPD Mother   . Depression Mother   . Paranoid behavior Mother   . Schizophrenia Mother   . Anxiety disorder Mother   . Obesity Mother   . Heart failure Father   . CAD Father   . Bell's palsy  Father   . Sudden death Father   . Hypertension Sister   . Edema Sister   . Hypertension Brother   . Asthma Brother   . Colon cancer Maternal Grandmother   . CAD Paternal Grandfather   . Other Brother        MVA  . CAD Paternal Uncle   . Bone cancer Paternal Uncle   . Colon cancer Paternal Uncle   . Breast cancer Other        aunt/82, 1st cousin/40's, 2nd cousin/30  . Rheum arthritis Other        2 aunts, 2 uncles  . Colon polyps Other        1 uncle  . Liver disease Neg Hx     ROS: Review of Systems  Constitutional: Positive for weight loss.  Gastrointestinal: Negative for nausea and vomiting.  Musculoskeletal:       Negative for muscle weakness    PHYSICAL EXAM: Blood pressure 130/90, pulse 91, temperature 98.2 F (36.8 C), temperature source Oral, height 5\' 5"  (1.651 m), weight (!) 304 lb (137.9 kg), SpO2 97 %. Body mass index is 50.59 kg/m. Physical  Exam  Constitutional: She is oriented to person, place, and time. She appears well-developed and well-nourished.  HENT:  Head: Normocephalic.  Eyes: Pupils are equal, round, and reactive to light.  Neck: Normal range of motion.  Cardiovascular: Normal rate.  Pulmonary/Chest: Effort normal.  Musculoskeletal: Normal range of motion.  Neurological: She is alert and oriented to person, place, and time.  Skin: Skin is warm and dry.  Psychiatric: She has a normal mood and affect. Her behavior is normal.  Vitals reviewed.   RECENT LABS AND TESTS: BMET    Component Value Date/Time   NA 141 08/28/2018 0919   K 4.5 08/28/2018 0919   CL 99 08/28/2018 0919   CO2 27 08/28/2018 0919   GLUCOSE 85 08/28/2018 0919   GLUCOSE 96 02/16/2013 0004   BUN 19 08/28/2018 0919   CREATININE 0.75 08/28/2018 0919   CALCIUM 9.3 08/28/2018 0919   GFRNONAA 91 08/28/2018 0919   GFRAA 105 08/28/2018 0919   Lab Results  Component Value Date   HGBA1C 5.4 08/28/2018   Lab Results  Component Value Date   INSULIN 18.2 08/28/2018   CBC    Component Value Date/Time   WBC 6.1 08/28/2018 0919   WBC 10.1 02/16/2013 0004   RBC 5.05 08/28/2018 0919   RBC 5.35 (H) 02/16/2013 0004   HGB 14.0 08/28/2018 0919   HCT 41.3 08/28/2018 0919   PLT 251 02/16/2013 0004   MCV 82 08/28/2018 0919   MCH 27.7 08/28/2018 0919   MCH 27.3 02/16/2013 0004   MCHC 33.9 08/28/2018 0919   MCHC 33.4 02/16/2013 0004   RDW 13.9 08/28/2018 0919   LYMPHSABS 1.2 08/28/2018 0919   MONOABS 0.8 02/16/2013 0004   EOSABS 0.2 08/28/2018 0919   BASOSABS 0.1 08/28/2018 0919   Iron/TIBC/Ferritin/ %Sat No results found for: IRON, TIBC, FERRITIN, IRONPCTSAT Lipid Panel     Component Value Date/Time   CHOL 177 08/28/2018 0919   TRIG 62 08/28/2018 0919   HDL 54 08/28/2018 0919   LDLCALC 111 (H) 08/28/2018 0919   Hepatic Function Panel     Component Value Date/Time   PROT 6.9 08/28/2018 0919   ALBUMIN 4.3 08/28/2018 0919   AST 21  08/28/2018 0919   ALT 22 08/28/2018 0919   ALKPHOS 62 08/28/2018 0919   BILITOT 0.3 08/28/2018 0919  Component Value Date/Time   TSH 2.390 08/28/2018 0919    Ref. Range 08/28/2018 09:19  Vitamin D, 25-Hydroxy Latest Ref Range: 30.0 - 100.0 ng/mL 35.2    ASSESSMENT AND PLAN: Vitamin D deficiency  Class 3 severe obesity with serious comorbidity and body mass index (BMI) of 50.0 to 59.9 in adult, unspecified obesity type (HCC)  PLAN: Vitamin D Deficiency Jill Palmer was informed that low vitamin D levels contributes to fatigue and are associated with obesity, breast, and colon cancer. She agrees to continue to take prescription Vit D @50 ,000 IU every week, no refill needed and will follow up for routine testing of vitamin D, at least 2-3 times per year. She was informed of the risk of over-replacement of vitamin D and agrees to not increase her dose unless she discusses this with us first. Agrees to follow up with our clinic as directed.   I spent > than 50% of the 15 minute visit on counseling as documented in the note.  Obesity Jill Palmer is currently in the action stage of change. As such, her goal is to continue with weight loss efforts She has agreed to follow the Category 3 plan Jill Palmer has not been prescribed exercise at this time. We discussed the following Behavioral Modification Strategies today: work on meal planning and easy cooking plans, better snacking choices, and holiday eating strategies.  Discussed options for protein at dinner and snacks.    Jill Palmer has agreed to follow up with our clinic in 2 weeks. She was informed of the importance of frequent follow up visits to maximize her success with intensive lifestyle modifications for her multiple health conditions.   OBESITY BEHAVIORAL INTERVENTION VISIT  Today's visit was # 3   Starting weight: 319 lb Starting date: 08/28/18 Today's weight : Weight: (!) 304 lb (137.9 kg)  Today's date: 09/25/18 Total lbs lost to date: 15  lb    ASK: We discussed the diagnosis of obesity with Ronald Pippinsonna L Mccarley today and Jill Palmer agreed to give us permission to discuss obesity behavioral modification therapy today.  ASSESS: Jill Palmer has the diagnosis of obesity and her BMI today is 50.59 Jill Palmer is in the action stage of change   ADVISE: Jill Palmer was educated on the multiple health risks of obesity as well as the benefit of weight loss to improve her health. She was advised of the need for long term treatment and the importance of lifestyle modifications to improve her current health and to decrease her risk of future health problems.  AGREE: Multiple dietary modification options and treatment options were discussed and  Jill Palmer agreed to follow the recommendations documented in the above note.  ARRANGE: Jill Palmer was educated on the importance of frequent visits to treat obesity as outlined per CMS and USPSTF guidelines and agreed to schedule her next follow up appointment today.  I, Jeralene PetersAshleigh Haynes, am acting as Energy managertranscriptionist for AshlandDawn Rashawnda Gaba, FNP.  I have reviewed the above documentation for accuracy and completeness, and I agree with the above.  - Lucious Zou, FNP-C.

## 2018-10-02 ENCOUNTER — Ambulatory Visit (INDEPENDENT_AMBULATORY_CARE_PROVIDER_SITE_OTHER): Payer: BLUE CROSS/BLUE SHIELD | Admitting: Psychology

## 2018-10-02 DIAGNOSIS — F4322 Adjustment disorder with anxiety: Secondary | ICD-10-CM | POA: Diagnosis not present

## 2018-10-02 NOTE — Progress Notes (Signed)
Office: [phone]  /  Fax: [phone]   Date: October 02, 2018 Time Seen: 4:30pm Duration: 30 minutes Provider: Lawerance CruelGaytri Danyell Shader, Psy.D. Type of Session: Individual Therapy   HPI: Jill Palmer was referred by Dr. Quillian Quincearen Palmer due to a positive depression screen and emotional eating, and was seen for an initial appointment by this provider on September 10, 2018. Per the note for the initial visit with Dr. Quillian Quincearen Palmer on August 28, 2018, "Jill Palmer has a positive depression screen with significant emotional eating. She is frustrated at her perceived lack of control and uses negative self talk when addressing her weight." Additionally, per the note for the initial visit with Dr. Quillian Quincearen Palmer on August 28, 2018, [place] been heavy most of herlife, and shestarted gaining weight around 1999. Herheaviest weight ever was 320-323 pounds. During the initial appointment with Dr. Dalbert Palmer, Jill Palmer reported experiencing the following: significant food cravings issues;  snacking frequently in the evenings; frequently drinking liquids with calories; frequently making poor food choices; frequently eating larger portions than normal; struggling with emotional eating. Additionally, she described herself as a picky eater and she does not like to eat healthier foods.   During the initial appointment with this provider, Jill Palmer reported her last episode of emotional eating was prior to starting with this clinic during which she consumed a "seasonal Frappuccino" from Jill Palmer. She stated she craves sweet and salty foods. Regarding the prescribed meal plan, Jill Palmer stated it is going "pretty good;" however, she noted difficulty eating her dinner on Thursday and Friday of last week due to nausea. She also acknowledged she did not eat all the food for dinner last night. Moreover, Jill Palmer reported a belief that the onset of emotional eating was after college. She denied a history of binge eating and overeating. Jill Palmer also denied ever  being diagnosed with an eating disorder, as well as a history of purging and engagement in other compensatory strategies. Furthermore, Jill Palmer was asked to complete a questionnaire assessing various behaviors related to emotional eating. Jill Palmer endorsed the following: overeat when you are celebrating, eat certain foods when you are anxious, stressed, depressed, or your feelings are hurt, use food to help you cope with emotional situations, find food is comforting to you and overeat when you are worried about something.  Session Content: Session focused on the following treatment goal: decrease emotional eating. The session was initiated with the administration of the PHQ-9 and GAD-7, as well as a brief check-in. Jill Palmer discussed ongoing stressors at work.  She indicated she lost 4 pounds during her last appointment with Jill Salvageawn Whitmire, FNP. During Thanksgiving, Jill Palmer indicated she made better choices. Regarding hunger, Jill Palmer identified experiencing physical hunger at different points of the day during work. As such, this provider engaged Jill Palmer in problem solving to assist in moving food from her meal plan around to ensure that she was eating when experiencing physical hunger. Additionally, psychoeducation regarding triggers for emotional eating was provided. Jill Palmer was provided a handout, and encouraged to utilize the handout between now and the next appointment to increase awareness of triggers and frequency. Jill Palmer agreed. Jill Palmer was receptive to today's session as evidenced by her openness to sharing, responsiveness to feedback, engagement in problem solving and willingness to identify triggers for emotional eating.  Mental Status Examination: Jill Palmer arrived on time for the appointment. She presented as appropriately dressed and groomed. Jill Palmer appeared her stated age and demonstrated adequate orientation to time, place, person, and purpose of the appointment. She also demonstrated appropriate eye contact. No  psychomotor abnormalities or behavioral peculiarities noted. Her mood was euthymic with congruent affect. Her thought processes were logical, linear, and goal-directed. No hallucinations, delusions, bizarre thinking or behavior reported or observed. Judgment, insight, and impulse control appeared to be grossly intact. There was no evidence of paraphasias (i.e., errors in speech, gross mispronunciations, and word substitutions), repetition deficits, or disturbances in volume or prosody (i.e., rhythm and intonation). There was no evidence of attention or memory impairments. Jill Palmer denied current suicidal and homicidal ideation, intent or plan.  Structured Assessment Results: The Patient Health Questionnaire-9 (PHQ-9) is a self-report measure that assesses symptoms and severity of depression over the course of the last two weeks. Jill Palmer obtained a score of zero. Depression screen PHQ 2/9 10/02/2018  Decreased Interest 0  Down, Depressed, Hopeless 0  PHQ - 2 Score 0  Altered sleeping 0  Tired, decreased energy 0  Change in appetite 0  Feeling bad or failure about yourself  0  Trouble concentrating 0  Moving slowly or fidgety/restless 0  Suicidal thoughts 0  PHQ-9 Score 0  Difficult doing work/chores -   The Generalized Anxiety Disorder-7 (GAD-7) is a brief self-report measure that assesses symptoms of anxiety over the course of the last two weeks. Jill Palmer obtained a score of one suggesting minimal anxiety. GAD 7 : Generalized Anxiety Score 10/02/2018  Nervous, Anxious, on Edge 1  Control/stop worrying 0  Worry too much - different things 0  Trouble relaxing 0  Restless 0  Easily annoyed or irritable 0  Afraid - awful might happen 0  Total GAD 7 Score 1  Anxiety Difficulty Not difficult at all   Interventions: Jill Palmer was administered the PHQ-9 and GAD-7 for symptom monitoring. Content from the last session was reviewed. Throughout today's session, empathic reflections and validation were provided.  Alsha was engaged in problem solving with her current meal plan. Psychoeducation regarding triggers for emotional eating was provided.  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 Kaelah has demonstrated progress in her goal of decreasing emotional eating as evidenced by increased awareness of hunger patterns, and willingness to identify triggers for emotional eating.  Plan: Breigh continues to appear able and willing to participate as evidenced by engagement in reciprocal conversation, and asking questions for clarification as appropriate. Due to the upcoming holidays and Jomarie's schedule, the next appointment will be scheduled in two to four weeks based on availability. The next session will focus on reviewing triggers, and working towards the established treatment goal.

## 2018-10-10 ENCOUNTER — Ambulatory Visit (INDEPENDENT_AMBULATORY_CARE_PROVIDER_SITE_OTHER): Payer: BLUE CROSS/BLUE SHIELD | Admitting: Physician Assistant

## 2018-10-10 ENCOUNTER — Encounter (INDEPENDENT_AMBULATORY_CARE_PROVIDER_SITE_OTHER): Payer: Self-pay | Admitting: Physician Assistant

## 2018-10-10 VITALS — BP 133/84 | HR 83 | Temp 98.3°F | Ht 65.0 in | Wt 307.0 lb

## 2018-10-10 DIAGNOSIS — I10 Essential (primary) hypertension: Secondary | ICD-10-CM | POA: Diagnosis not present

## 2018-10-10 DIAGNOSIS — Z9189 Other specified personal risk factors, not elsewhere classified: Secondary | ICD-10-CM | POA: Diagnosis not present

## 2018-10-10 DIAGNOSIS — Z6841 Body Mass Index (BMI) 40.0 and over, adult: Secondary | ICD-10-CM

## 2018-10-10 DIAGNOSIS — E559 Vitamin D deficiency, unspecified: Secondary | ICD-10-CM

## 2018-10-10 MED ORDER — VITAMIN D (ERGOCALCIFEROL) 1.25 MG (50000 UNIT) PO CAPS: 50000.0000 [IU] | ORAL_CAPSULE | ORAL | 0 refills | Status: DC

## 2018-10-10 NOTE — Progress Notes (Signed)
Office: [phone]  /  Fax: [phone]   HPI:   Chief Complaint: OBESITY Jill Palmer is here to discuss her progress with her obesity treatment plan. She is on the  follow the Category 3 plan and is following her eating plan approximately 70 % of the time. She states she is exercising 0 minutes 0 times per week. Jill Palmer reports that she has not been getting enough protein in due to a busy schedule at work. She states that she skips a meal on some days. She is bored with the plan.  Her weight is (!) 307 lb (139.3 kg) today and has had a weight gain of 3 pounds over a period of 2 weeks since her last visit. She has lost 12 lbs since starting treatment with [place].  Vitamin D deficiency Jill Palmer has a diagnosis of vitamin D deficiency. She is currently taking vit D and denies nausea, vomiting or muscle weakness.  Ref. Range 08/28/2018 09:19  Vitamin D, 25-Hydroxy Latest Ref Range: 30.0 - 100.0 ng/mL 35.2   Hypertension Jill Palmer is a 52 y.o. female with hypertension.  Jill Palmer denies chest pain or shortness of breath on exertion. She is working weight loss to help control her blood pressure with the goal of decreasing her risk of heart attack and stroke. Jill Palmer blood pressure is currently controlled. She is currently on triamterene-hctz.   At risk for osteopenia and osteoporosis Jill Palmer is at higher risk of osteopenia and osteoporosis due to vitamin D deficiency.   ALLERGIES: Allergies  Allergen Reactions  . Septra [Sulfamethoxazole-Trimethoprim] Rash  . Latex Itching  . Penicillins Itching  . Tessalon Perles [Benzonatate] Other (See Comments)    Muscle weakness   . Meloxicam Rash    MEDICATIONS: Current Outpatient Medications on File Prior to Visit  Medication Sig Dispense Refill  . b complex vitamins tablet Take 1 tablet by mouth daily as needed.    . calcium carbonate (OS-CAL) 600 MG TABS Take 600 mg by mouth every morning.     . Multiple Vitamin (MULTIVITAMIN WITH MINERALS)  TABS Take 1 tablet by mouth every morning.     . triamterene-hydrochlorothiazide (MAXZIDE-25) 37.5-25 MG per tablet Take 1 tablet every morning by mouth. For fluid    . vitamin C (ASCORBIC ACID) 500 MG tablet Take 500 mg by mouth daily as needed.     No current facility-administered medications on file prior to visit.     PAST MEDICAL HISTORY: Past Medical History:  Diagnosis Date  . Anemia   . Anxiety   . Back pain   . Bone spur   . Bursitis of right foot   . Chest pain   . Edema   . GERD (gastroesophageal reflux disease)   . Hernia, hiatal   . Hypertension   . Joint pain   . Lymphedema of lower extremity   . Osteoarthritis   . Palpitations   . Soft tissue infection    left leg  . Thyroid disease   . Venous stasis     PAST SURGICAL HISTORY: Past Surgical History:  Procedure Laterality Date  . ABDOMINAL HYSTERECTOMY    . EYE SURGERY    . TONSILLECTOMY      SOCIAL HISTORY: Social History   Tobacco Use  . Smoking status: Never Smoker  . Smokeless tobacco: Never Used  Substance Use Topics  . Alcohol use: No  . Drug use: No    FAMILY HISTORY: Family History  Problem Relation Age of Onset  .  Heart failure Mother   . CAD Mother   . CVA Mother   . Hypertension Mother   . Asthma Mother   . COPD Mother   . Depression Mother   . Paranoid behavior Mother   . Schizophrenia Mother   . Anxiety disorder Mother   . Obesity Mother   . Heart failure Father   . CAD Father   . Bell's palsy Father   . Sudden death Father   . Hypertension Sister   . Edema Sister   . Hypertension Brother   . Asthma Brother   . Colon cancer Maternal Grandmother   . CAD Paternal Grandfather   . Other Brother        MVA  . CAD Paternal Uncle   . Bone cancer Paternal Uncle   . Colon cancer Paternal Uncle   . Breast cancer Other        aunt/82, 1st cousin/40's, 2nd cousin/30  . Rheum arthritis Other        2 aunts, 2 uncles  . Colon polyps Other        1 uncle  . Liver disease  Neg Hx     ROS: Review of Systems  Constitutional: Negative for weight loss.  Respiratory: Negative for shortness of breath.   Cardiovascular: Negative for chest pain.  Gastrointestinal: Negative for nausea and vomiting.  Musculoskeletal:       Negative for muscle weakness    PHYSICAL EXAM: Blood pressure 133/84, pulse 83, temperature 98.3 F (36.8 C), temperature source Oral, height 5\' 5"  (1.651 m), weight (!) 307 lb (139.3 kg), SpO2 99 %. Body mass index is 51.09 kg/m. Physical Exam Constitutional:      Appearance: Normal appearance. She is obese.  Neck:     Musculoskeletal: Normal range of motion.  Cardiovascular:     Rate and Rhythm: Normal rate.  Pulmonary:     Effort: Pulmonary effort is normal.  Musculoskeletal: Normal range of motion.  Skin:    General: Skin is warm and dry.  Neurological:     Mental Status: She is alert and oriented to person, place, and time.  Psychiatric:        Mood and Affect: Mood normal.        Behavior: Behavior normal.     RECENT LABS AND TESTS: BMET    Component Value Date/Time   NA 141 08/28/2018 0919   K 4.5 08/28/2018 0919   CL 99 08/28/2018 0919   CO2 27 08/28/2018 0919   GLUCOSE 85 08/28/2018 0919   GLUCOSE 96 02/16/2013 0004   BUN 19 08/28/2018 0919   CREATININE 0.75 08/28/2018 0919   CALCIUM 9.3 08/28/2018 0919   GFRNONAA 91 08/28/2018 0919   GFRAA 105 08/28/2018 0919   Lab Results  Component Value Date   HGBA1C 5.4 08/28/2018   Lab Results  Component Value Date   INSULIN 18.2 08/28/2018   CBC    Component Value Date/Time   WBC 6.1 08/28/2018 0919   WBC 10.1 02/16/2013 0004   RBC 5.05 08/28/2018 0919   RBC 5.35 (H) 02/16/2013 0004   HGB 14.0 08/28/2018 0919   HCT 41.3 08/28/2018 0919   PLT 251 02/16/2013 0004   MCV 82 08/28/2018 0919   MCH 27.7 08/28/2018 0919   MCH 27.3 02/16/2013 0004   MCHC 33.9 08/28/2018 0919   MCHC 33.4 02/16/2013 0004   RDW 13.9 08/28/2018 0919   LYMPHSABS 1.2 08/28/2018  0919   MONOABS 0.8 02/16/2013 0004   EOSABS  0.2 08/28/2018 0919   BASOSABS 0.1 08/28/2018 0919   Iron/TIBC/Ferritin/ %Sat No results found for: IRON, TIBC, FERRITIN, IRONPCTSAT Lipid Panel     Component Value Date/Time   CHOL 177 08/28/2018 0919   TRIG 62 08/28/2018 0919   HDL 54 08/28/2018 0919   LDLCALC 111 (H) 08/28/2018 0919   Hepatic Function Panel     Component Value Date/Time   PROT 6.9 08/28/2018 0919   ALBUMIN 4.3 08/28/2018 0919   AST 21 08/28/2018 0919   ALT 22 08/28/2018 0919   ALKPHOS 62 08/28/2018 0919   BILITOT 0.3 08/28/2018 0919      Component Value Date/Time   TSH 2.390 08/28/2018 0919    Ref. Range 08/28/2018 09:19  Vitamin D, 25-Hydroxy Latest Ref Range: 30.0 - 100.0 ng/mL 35.2    ASSESSMENT AND PLAN: Vitamin D deficiency - Plan: Vitamin D, Ergocalciferol, (DRISDOL) 1.25 MG (50000 UT) CAPS capsule  Essential hypertension  At risk for osteoporosis  Class 3 severe obesity with serious comorbidity and body mass index (BMI) of 50.0 to 59.9 in adult, unspecified obesity type (HCC)  PLAN: Vitamin D Deficiency Jill Palmer was informed that low vitamin D levels contributes to fatigue and are associated with obesity, breast, and colon cancer. She agrees to continue to take prescription Vit D @50 ,000 IU every week #4 with no refills and will follow up for routine testing of vitamin D, at least 2-3 times per year. She was informed of the risk of over-replacement of vitamin D and agrees to not increase her dose unless she discusses this with [place] first. Agrees to follow up with our clinic as directed.   Hypertension We discussed sodium restriction, working on healthy weight loss, and a regular exercise program as the means to achieve improved blood pressure control. Jill Palmer agreed with this plan and agreed to follow up as directed. We will continue to monitor her blood pressure as well as her progress with the above lifestyle modifications. She will continue her  medications as prescribed and will watch for signs of hypotension as she continues her lifestyle modifications.  At risk for osteopenia and osteoporosis Jill Palmer was given extended  (15 minutes) osteoporosis prevention counseling today. Jill Palmer is at risk for osteopenia and osteoporosis due to her vitamin D deficiency. She was encouraged to take her vitamin D and follow her higher calcium diet and increase strengthening exercise to help strengthen her bones and decrease her risk of osteopenia and osteoporosis.  Obesity Jill Palmer is currently in the action stage of change. As such, her goal is to continue with weight loss efforts She has agreed to follow the Category 3 plan Jill Palmer has been instructed to work up to a goal of 150 minutes of combined cardio and strengthening exercise per week for weight loss and overall health benefits. We discussed the following Behavioral Modification Strategies today: work on meal planning and easy cooking plans and holiday eating strategies    Jill Palmer has agreed to follow up with our clinic in 3-4 weeks. She was informed of the importance of frequent follow up visits to maximize her success with intensive lifestyle modifications for her multiple health conditions.   OBESITY BEHAVIORAL INTERVENTION VISIT  Today's visit was # 4   Starting weight: 319 lb Starting date: 08/28/18 Today's weight : Weight: (!) 307 lb (139.3 kg)  Today's date: 10/10/2018 Total lbs lost to date: 12 lb    ASK: We discussed the diagnosis of obesity with Jill Palmer today and Zakari agreed to give [place]  permission to discuss obesity behavioral modification therapy today.  ASSESS: Jill Palmer has the diagnosis of obesity and her BMI today is 51.09 Jill Palmer is in the action stage of change   ADVISE: Jill Palmer was educated on the multiple health risks of obesity as well as the benefit of weight loss to improve her health. She was advised of the need for long term treatment and the importance of  lifestyle modifications to improve her current health and to decrease her risk of future health problems.  AGREE: Multiple dietary modification options and treatment options were discussed and  Jill Palmer agreed to follow the recommendations documented in the above note.  ARRANGE: Jill Palmer was educated on the importance of frequent visits to treat obesity as outlined per CMS and USPSTF guidelines and agreed to schedule her next follow up appointment today.  Otis PeakI, Ashleigh Haynes, am acting as transcriptionist for Alois Clicheracey Airi Copado, PA-C I, Alois Clicheracey Byrant Valent, PA-C have reviewed above note and agree with its content

## 2018-10-16 ENCOUNTER — Ambulatory Visit (INDEPENDENT_AMBULATORY_CARE_PROVIDER_SITE_OTHER): Payer: Self-pay | Admitting: Psychology

## 2018-11-11 ENCOUNTER — Encounter (INDEPENDENT_AMBULATORY_CARE_PROVIDER_SITE_OTHER): Payer: Self-pay

## 2018-11-11 ENCOUNTER — Ambulatory Visit (INDEPENDENT_AMBULATORY_CARE_PROVIDER_SITE_OTHER): Payer: Self-pay | Admitting: Family Medicine

## 2018-11-14 ENCOUNTER — Encounter (INDEPENDENT_AMBULATORY_CARE_PROVIDER_SITE_OTHER): Payer: Self-pay

## 2018-12-31 DIAGNOSIS — J208 Acute bronchitis due to other specified organisms: Secondary | ICD-10-CM | POA: Diagnosis not present

## 2019-01-29 DIAGNOSIS — R609 Edema, unspecified: Secondary | ICD-10-CM | POA: Diagnosis not present

## 2019-01-29 DIAGNOSIS — E039 Hypothyroidism, unspecified: Secondary | ICD-10-CM | POA: Diagnosis not present

## 2019-01-29 DIAGNOSIS — I10 Essential (primary) hypertension: Secondary | ICD-10-CM | POA: Diagnosis not present

## 2019-03-20 DIAGNOSIS — H04123 Dry eye syndrome of bilateral lacrimal glands: Secondary | ICD-10-CM | POA: Diagnosis not present

## 2019-04-11 DIAGNOSIS — H04123 Dry eye syndrome of bilateral lacrimal glands: Secondary | ICD-10-CM | POA: Diagnosis not present

## 2019-04-11 DIAGNOSIS — H5213 Myopia, bilateral: Secondary | ICD-10-CM | POA: Diagnosis not present

## 2019-08-11 DIAGNOSIS — L989 Disorder of the skin and subcutaneous tissue, unspecified: Secondary | ICD-10-CM | POA: Diagnosis not present

## 2019-08-11 DIAGNOSIS — I10 Essential (primary) hypertension: Secondary | ICD-10-CM | POA: Diagnosis not present

## 2019-08-11 DIAGNOSIS — R937 Abnormal findings on diagnostic imaging of other parts of musculoskeletal system: Secondary | ICD-10-CM | POA: Diagnosis not present

## 2019-08-11 DIAGNOSIS — Z0000 Encounter for general adult medical examination without abnormal findings: Secondary | ICD-10-CM | POA: Diagnosis not present

## 2019-08-11 DIAGNOSIS — E559 Vitamin D deficiency, unspecified: Secondary | ICD-10-CM | POA: Diagnosis not present

## 2019-08-13 ENCOUNTER — Other Ambulatory Visit: Payer: Self-pay | Admitting: Family Medicine

## 2019-08-13 DIAGNOSIS — R937 Abnormal findings on diagnostic imaging of other parts of musculoskeletal system: Secondary | ICD-10-CM

## 2019-08-19 DIAGNOSIS — Z1231 Encounter for screening mammogram for malignant neoplasm of breast: Secondary | ICD-10-CM | POA: Diagnosis not present

## 2019-08-29 DIAGNOSIS — H0100B Unspecified blepharitis left eye, upper and lower eyelids: Secondary | ICD-10-CM | POA: Diagnosis not present

## 2019-08-29 DIAGNOSIS — H00014 Hordeolum externum left upper eyelid: Secondary | ICD-10-CM | POA: Diagnosis not present

## 2019-09-01 DIAGNOSIS — R946 Abnormal results of thyroid function studies: Secondary | ICD-10-CM | POA: Diagnosis not present

## 2019-09-01 DIAGNOSIS — I10 Essential (primary) hypertension: Secondary | ICD-10-CM | POA: Diagnosis not present

## 2019-09-01 DIAGNOSIS — E559 Vitamin D deficiency, unspecified: Secondary | ICD-10-CM | POA: Diagnosis not present

## 2019-12-25 ENCOUNTER — Ambulatory Visit
Admission: RE | Admit: 2019-12-25 | Discharge: 2019-12-25 | Disposition: A | Discharge status: Home / Self Care | Payer: BC Managed Care – PPO | Source: Ambulatory Visit | Attending: Family Medicine | Admitting: Family Medicine

## 2019-12-25 ENCOUNTER — Other Ambulatory Visit: Payer: Self-pay

## 2019-12-25 DIAGNOSIS — M47814 Spondylosis without myelopathy or radiculopathy, thoracic region: Secondary | ICD-10-CM | POA: Diagnosis not present

## 2019-12-25 DIAGNOSIS — R937 Abnormal findings on diagnostic imaging of other parts of musculoskeletal system: Secondary | ICD-10-CM

## 2019-12-25 MED ORDER — GADOBENATE DIMEGLUMINE 529 MG/ML IV SOLN
20.0000 mL | Freq: Once | INTRAVENOUS | Status: AC | PRN
  Administered 2019-12-25: 20 mL via INTRAVENOUS

## 2020-01-09 DIAGNOSIS — L720 Epidermal cyst: Secondary | ICD-10-CM | POA: Diagnosis not present

## 2020-01-09 DIAGNOSIS — D485 Neoplasm of uncertain behavior of skin: Secondary | ICD-10-CM | POA: Diagnosis not present

## 2020-01-09 DIAGNOSIS — D225 Melanocytic nevi of trunk: Secondary | ICD-10-CM | POA: Diagnosis not present

## 2020-01-09 DIAGNOSIS — L308 Other specified dermatitis: Secondary | ICD-10-CM | POA: Diagnosis not present

## 2020-01-09 DIAGNOSIS — D1723 Benign lipomatous neoplasm of skin and subcutaneous tissue of right leg: Secondary | ICD-10-CM | POA: Diagnosis not present

## 2020-01-09 DIAGNOSIS — L304 Erythema intertrigo: Secondary | ICD-10-CM | POA: Diagnosis not present

## 2020-02-04 DIAGNOSIS — Z713 Dietary counseling and surveillance: Secondary | ICD-10-CM | POA: Diagnosis not present

## 2020-02-09 DIAGNOSIS — R946 Abnormal results of thyroid function studies: Secondary | ICD-10-CM | POA: Diagnosis not present

## 2020-02-09 DIAGNOSIS — I10 Essential (primary) hypertension: Secondary | ICD-10-CM | POA: Diagnosis not present

## 2020-02-09 DIAGNOSIS — I890 Lymphedema, not elsewhere classified: Secondary | ICD-10-CM | POA: Diagnosis not present

## 2020-02-09 DIAGNOSIS — E559 Vitamin D deficiency, unspecified: Secondary | ICD-10-CM | POA: Diagnosis not present

## 2020-02-20 DIAGNOSIS — E042 Nontoxic multinodular goiter: Secondary | ICD-10-CM | POA: Diagnosis not present

## 2020-02-20 DIAGNOSIS — E049 Nontoxic goiter, unspecified: Secondary | ICD-10-CM | POA: Diagnosis not present

## 2020-02-25 DIAGNOSIS — Z713 Dietary counseling and surveillance: Secondary | ICD-10-CM | POA: Diagnosis not present

## 2020-02-26 ENCOUNTER — Encounter (HOSPITAL_BASED_OUTPATIENT_CLINIC_OR_DEPARTMENT_OTHER): Payer: BC Managed Care – PPO | Admitting: Internal Medicine

## 2020-02-26 DIAGNOSIS — Z6841 Body Mass Index (BMI) 40.0 and over, adult: Secondary | ICD-10-CM | POA: Diagnosis not present

## 2020-02-26 DIAGNOSIS — Z713 Dietary counseling and surveillance: Secondary | ICD-10-CM | POA: Diagnosis not present

## 2020-02-26 DIAGNOSIS — I10 Essential (primary) hypertension: Secondary | ICD-10-CM | POA: Diagnosis not present

## 2020-02-26 DIAGNOSIS — Z7189 Other specified counseling: Secondary | ICD-10-CM | POA: Diagnosis not present

## 2020-03-23 DIAGNOSIS — Z713 Dietary counseling and surveillance: Secondary | ICD-10-CM | POA: Diagnosis not present

## 2020-03-31 DIAGNOSIS — Z713 Dietary counseling and surveillance: Secondary | ICD-10-CM | POA: Diagnosis not present

## 2020-03-31 DIAGNOSIS — M169 Osteoarthritis of hip, unspecified: Secondary | ICD-10-CM | POA: Diagnosis not present

## 2020-03-31 DIAGNOSIS — Z6841 Body Mass Index (BMI) 40.0 and over, adult: Secondary | ICD-10-CM | POA: Diagnosis not present

## 2020-03-31 DIAGNOSIS — R600 Localized edema: Secondary | ICD-10-CM | POA: Diagnosis not present

## 2020-04-13 DIAGNOSIS — H04123 Dry eye syndrome of bilateral lacrimal glands: Secondary | ICD-10-CM | POA: Diagnosis not present

## 2020-04-13 DIAGNOSIS — H524 Presbyopia: Secondary | ICD-10-CM | POA: Diagnosis not present

## 2020-04-19 DIAGNOSIS — K219 Gastro-esophageal reflux disease without esophagitis: Secondary | ICD-10-CM | POA: Diagnosis not present

## 2020-04-19 DIAGNOSIS — Z6841 Body Mass Index (BMI) 40.0 and over, adult: Secondary | ICD-10-CM | POA: Diagnosis not present

## 2020-04-20 DIAGNOSIS — Z713 Dietary counseling and surveillance: Secondary | ICD-10-CM | POA: Diagnosis not present

## 2020-05-27 DIAGNOSIS — E559 Vitamin D deficiency, unspecified: Secondary | ICD-10-CM | POA: Diagnosis not present

## 2020-05-27 DIAGNOSIS — R011 Cardiac murmur, unspecified: Secondary | ICD-10-CM | POA: Diagnosis not present

## 2020-05-27 DIAGNOSIS — E049 Nontoxic goiter, unspecified: Secondary | ICD-10-CM | POA: Diagnosis not present

## 2020-05-27 DIAGNOSIS — I10 Essential (primary) hypertension: Secondary | ICD-10-CM | POA: Diagnosis not present

## 2020-05-27 DIAGNOSIS — K219 Gastro-esophageal reflux disease without esophagitis: Secondary | ICD-10-CM | POA: Diagnosis not present

## 2020-06-07 DIAGNOSIS — Z713 Dietary counseling and surveillance: Secondary | ICD-10-CM | POA: Diagnosis not present

## 2020-07-07 DIAGNOSIS — Z713 Dietary counseling and surveillance: Secondary | ICD-10-CM | POA: Diagnosis not present

## 2020-08-02 DIAGNOSIS — Z713 Dietary counseling and surveillance: Secondary | ICD-10-CM | POA: Diagnosis not present

## 2020-08-10 ENCOUNTER — Other Ambulatory Visit: Payer: Self-pay | Admitting: Family Medicine

## 2020-08-11 ENCOUNTER — Other Ambulatory Visit (HOSPITAL_COMMUNITY)
Admission: RE | Admit: 2020-08-11 | Discharge: 2020-08-11 | Disposition: A | Discharge status: Home / Self Care | Payer: BC Managed Care – PPO | Source: Ambulatory Visit | Attending: Family Medicine | Admitting: Family Medicine

## 2020-08-11 DIAGNOSIS — Z23 Encounter for immunization: Secondary | ICD-10-CM | POA: Diagnosis not present

## 2020-08-11 DIAGNOSIS — Z124 Encounter for screening for malignant neoplasm of cervix: Secondary | ICD-10-CM | POA: Diagnosis not present

## 2020-08-11 DIAGNOSIS — Z0000 Encounter for general adult medical examination without abnormal findings: Secondary | ICD-10-CM | POA: Diagnosis not present

## 2020-08-11 DIAGNOSIS — Z1159 Encounter for screening for other viral diseases: Secondary | ICD-10-CM | POA: Diagnosis not present

## 2020-08-11 DIAGNOSIS — R102 Pelvic and perineal pain: Secondary | ICD-10-CM | POA: Diagnosis not present

## 2020-08-11 DIAGNOSIS — E559 Vitamin D deficiency, unspecified: Secondary | ICD-10-CM | POA: Diagnosis not present

## 2020-08-11 DIAGNOSIS — I10 Essential (primary) hypertension: Secondary | ICD-10-CM | POA: Diagnosis not present

## 2020-08-12 LAB — CYTOLOGY - PAP
Comment: NEGATIVE
Diagnosis: NEGATIVE
High risk HPV: NEGATIVE

## 2020-08-13 ENCOUNTER — Other Ambulatory Visit: Payer: Self-pay | Admitting: Family Medicine

## 2020-08-13 DIAGNOSIS — R102 Pelvic and perineal pain: Secondary | ICD-10-CM

## 2020-08-17 DIAGNOSIS — Z1231 Encounter for screening mammogram for malignant neoplasm of breast: Secondary | ICD-10-CM | POA: Diagnosis not present

## 2020-09-01 ENCOUNTER — Ambulatory Visit
Admission: RE | Admit: 2020-09-01 | Discharge: 2020-09-01 | Disposition: A | Discharge status: Home / Self Care | Payer: BC Managed Care – PPO | Source: Ambulatory Visit | Attending: Family Medicine | Admitting: Family Medicine

## 2020-09-01 DIAGNOSIS — R102 Pelvic and perineal pain: Secondary | ICD-10-CM

## 2020-09-01 DIAGNOSIS — K439 Ventral hernia without obstruction or gangrene: Secondary | ICD-10-CM | POA: Diagnosis not present

## 2020-09-01 DIAGNOSIS — K469 Unspecified abdominal hernia without obstruction or gangrene: Secondary | ICD-10-CM | POA: Diagnosis not present

## 2020-09-01 DIAGNOSIS — K429 Umbilical hernia without obstruction or gangrene: Secondary | ICD-10-CM | POA: Diagnosis not present

## 2020-09-01 MED ORDER — IOPAMIDOL (ISOVUE-300) INJECTION 61%
100.0000 mL | Freq: Once | INTRAVENOUS | Status: AC | PRN
  Administered 2020-09-01: 100 mL via INTRAVENOUS

## 2020-09-14 ENCOUNTER — Other Ambulatory Visit: Payer: Self-pay | Admitting: Family Medicine

## 2020-09-14 DIAGNOSIS — R935 Abnormal findings on diagnostic imaging of other abdominal regions, including retroperitoneum: Secondary | ICD-10-CM

## 2020-09-27 DIAGNOSIS — D485 Neoplasm of uncertain behavior of skin: Secondary | ICD-10-CM | POA: Diagnosis not present

## 2020-10-04 ENCOUNTER — Ambulatory Visit
Admission: RE | Admit: 2020-10-04 | Discharge: 2020-10-04 | Disposition: A | Discharge status: Home / Self Care | Payer: BC Managed Care – PPO | Source: Ambulatory Visit | Attending: Family Medicine | Admitting: Family Medicine

## 2020-10-04 DIAGNOSIS — N888 Other specified noninflammatory disorders of cervix uteri: Secondary | ICD-10-CM | POA: Diagnosis not present

## 2020-10-04 DIAGNOSIS — R935 Abnormal findings on diagnostic imaging of other abdominal regions, including retroperitoneum: Secondary | ICD-10-CM

## 2020-10-04 DIAGNOSIS — N83312 Acquired atrophy of left ovary: Secondary | ICD-10-CM | POA: Diagnosis not present

## 2020-10-04 DIAGNOSIS — N83311 Acquired atrophy of right ovary: Secondary | ICD-10-CM | POA: Diagnosis not present

## 2020-10-04 DIAGNOSIS — N926 Irregular menstruation, unspecified: Secondary | ICD-10-CM | POA: Diagnosis not present

## 2020-10-18 DIAGNOSIS — D485 Neoplasm of uncertain behavior of skin: Secondary | ICD-10-CM | POA: Diagnosis not present

## 2020-11-08 DIAGNOSIS — E785 Hyperlipidemia, unspecified: Secondary | ICD-10-CM | POA: Diagnosis not present

## 2020-11-08 DIAGNOSIS — Z79899 Other long term (current) drug therapy: Secondary | ICD-10-CM | POA: Diagnosis not present

## 2020-12-06 DIAGNOSIS — K625 Hemorrhage of anus and rectum: Secondary | ICD-10-CM | POA: Diagnosis not present

## 2020-12-06 DIAGNOSIS — N888 Other specified noninflammatory disorders of cervix uteri: Secondary | ICD-10-CM | POA: Diagnosis not present

## 2020-12-06 DIAGNOSIS — R102 Pelvic and perineal pain: Secondary | ICD-10-CM | POA: Diagnosis not present

## 2020-12-09 DIAGNOSIS — H00015 Hordeolum externum left lower eyelid: Secondary | ICD-10-CM | POA: Diagnosis not present

## 2020-12-20 DIAGNOSIS — M62838 Other muscle spasm: Secondary | ICD-10-CM | POA: Diagnosis not present

## 2020-12-20 DIAGNOSIS — M25551 Pain in right hip: Secondary | ICD-10-CM | POA: Diagnosis not present

## 2020-12-20 DIAGNOSIS — M6289 Other specified disorders of muscle: Secondary | ICD-10-CM | POA: Diagnosis not present

## 2020-12-20 DIAGNOSIS — M25552 Pain in left hip: Secondary | ICD-10-CM | POA: Diagnosis not present

## 2021-01-04 DIAGNOSIS — M25552 Pain in left hip: Secondary | ICD-10-CM | POA: Diagnosis not present

## 2021-01-04 DIAGNOSIS — M25551 Pain in right hip: Secondary | ICD-10-CM | POA: Diagnosis not present

## 2021-01-04 DIAGNOSIS — M6289 Other specified disorders of muscle: Secondary | ICD-10-CM | POA: Diagnosis not present

## 2021-01-04 DIAGNOSIS — M62838 Other muscle spasm: Secondary | ICD-10-CM | POA: Diagnosis not present

## 2021-01-10 DIAGNOSIS — I788 Other diseases of capillaries: Secondary | ICD-10-CM | POA: Diagnosis not present

## 2021-01-10 DIAGNOSIS — D225 Melanocytic nevi of trunk: Secondary | ICD-10-CM | POA: Diagnosis not present

## 2021-01-10 DIAGNOSIS — L918 Other hypertrophic disorders of the skin: Secondary | ICD-10-CM | POA: Diagnosis not present

## 2021-01-10 DIAGNOSIS — L821 Other seborrheic keratosis: Secondary | ICD-10-CM | POA: Diagnosis not present

## 2021-01-19 DIAGNOSIS — M6289 Other specified disorders of muscle: Secondary | ICD-10-CM | POA: Diagnosis not present

## 2021-01-19 DIAGNOSIS — M25551 Pain in right hip: Secondary | ICD-10-CM | POA: Diagnosis not present

## 2021-01-19 DIAGNOSIS — M25552 Pain in left hip: Secondary | ICD-10-CM | POA: Diagnosis not present

## 2021-01-19 DIAGNOSIS — M62838 Other muscle spasm: Secondary | ICD-10-CM | POA: Diagnosis not present

## 2021-01-24 DIAGNOSIS — M5442 Lumbago with sciatica, left side: Secondary | ICD-10-CM | POA: Diagnosis not present

## 2021-02-07 DIAGNOSIS — M2560 Stiffness of unspecified joint, not elsewhere classified: Secondary | ICD-10-CM | POA: Diagnosis not present

## 2021-02-07 DIAGNOSIS — R531 Weakness: Secondary | ICD-10-CM | POA: Diagnosis not present

## 2021-02-07 DIAGNOSIS — M5442 Lumbago with sciatica, left side: Secondary | ICD-10-CM | POA: Diagnosis not present

## 2021-02-10 DIAGNOSIS — M5442 Lumbago with sciatica, left side: Secondary | ICD-10-CM | POA: Diagnosis not present

## 2021-02-10 DIAGNOSIS — Z79899 Other long term (current) drug therapy: Secondary | ICD-10-CM | POA: Diagnosis not present

## 2021-02-10 DIAGNOSIS — I10 Essential (primary) hypertension: Secondary | ICD-10-CM | POA: Diagnosis not present

## 2021-02-10 DIAGNOSIS — E785 Hyperlipidemia, unspecified: Secondary | ICD-10-CM | POA: Diagnosis not present

## 2021-02-11 DIAGNOSIS — M2560 Stiffness of unspecified joint, not elsewhere classified: Secondary | ICD-10-CM | POA: Diagnosis not present

## 2021-02-11 DIAGNOSIS — R531 Weakness: Secondary | ICD-10-CM | POA: Diagnosis not present

## 2021-02-11 DIAGNOSIS — M5442 Lumbago with sciatica, left side: Secondary | ICD-10-CM | POA: Diagnosis not present

## 2021-02-15 DIAGNOSIS — M6289 Other specified disorders of muscle: Secondary | ICD-10-CM | POA: Diagnosis not present

## 2021-02-15 DIAGNOSIS — M25552 Pain in left hip: Secondary | ICD-10-CM | POA: Diagnosis not present

## 2021-02-15 DIAGNOSIS — M62838 Other muscle spasm: Secondary | ICD-10-CM | POA: Diagnosis not present

## 2021-02-15 DIAGNOSIS — M25551 Pain in right hip: Secondary | ICD-10-CM | POA: Diagnosis not present

## 2021-02-18 DIAGNOSIS — M5442 Lumbago with sciatica, left side: Secondary | ICD-10-CM | POA: Diagnosis not present

## 2021-02-18 DIAGNOSIS — R531 Weakness: Secondary | ICD-10-CM | POA: Diagnosis not present

## 2021-02-18 DIAGNOSIS — M2560 Stiffness of unspecified joint, not elsewhere classified: Secondary | ICD-10-CM | POA: Diagnosis not present

## 2021-02-23 IMAGING — US US PELVIS COMPLETE WITH TRANSVAGINAL
1 series · 13 of 25 positions shown · non-contrast
Comparison: 05/21/2017

Correlation: CT abdomen and pelvis 09/01/2020

CLINICAL DATA: Irregular cervix on CT

EXAM:
TRANSABDOMINAL AND TRANSVAGINAL ULTRASOUND OF PELVIS
TECHNIQUE: Both transabdominal and transvaginal ultrasound examinations of the
pelvis were performed. Transabdominal technique was performed for
global imaging of the pelvis including uterus, ovaries, adnexal
regions, and pelvic cul-de-sac. It was necessary to proceed with
endovaginal exam following the transabdominal exam to visualize the
cervix and ovaries.

[Series 1: us pelvis complete with transvaginal · 0.34mm/px · 50 acquisitions, 13 frames shown]
[im 1/50]
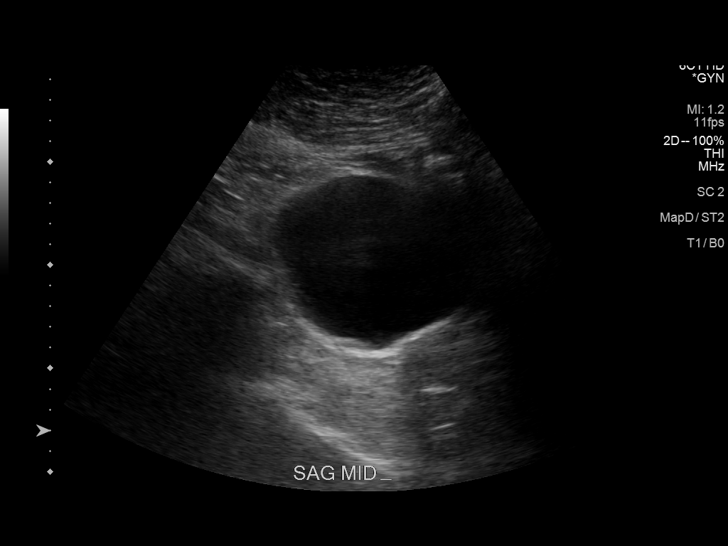
[im 5/50]
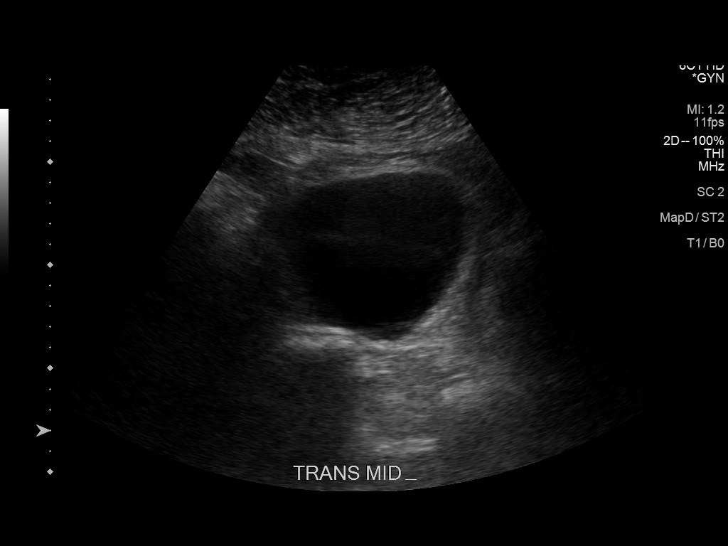
[im 9/50]
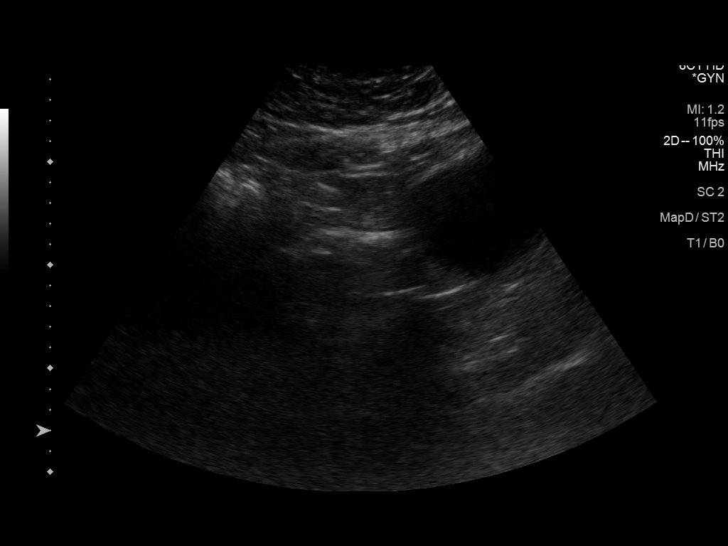
[im 13/50]
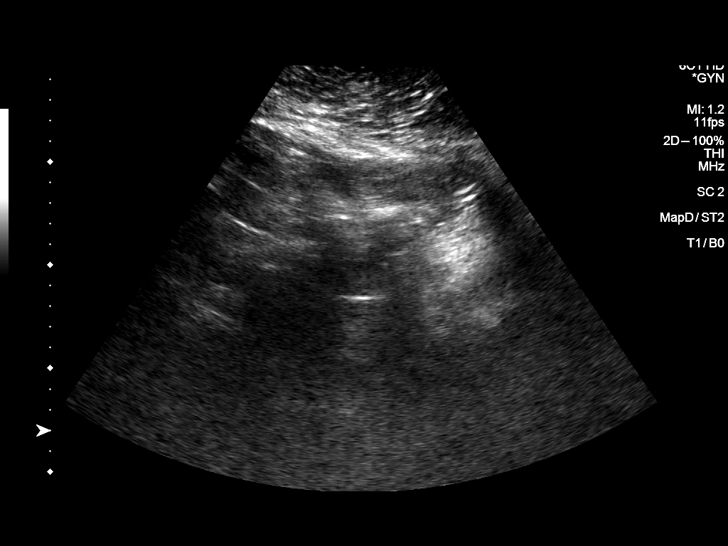
[im 17/50]
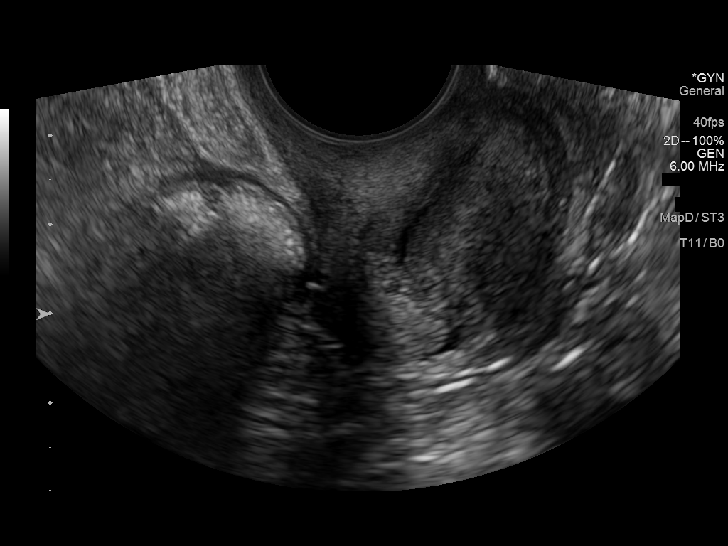
[im 21/50]
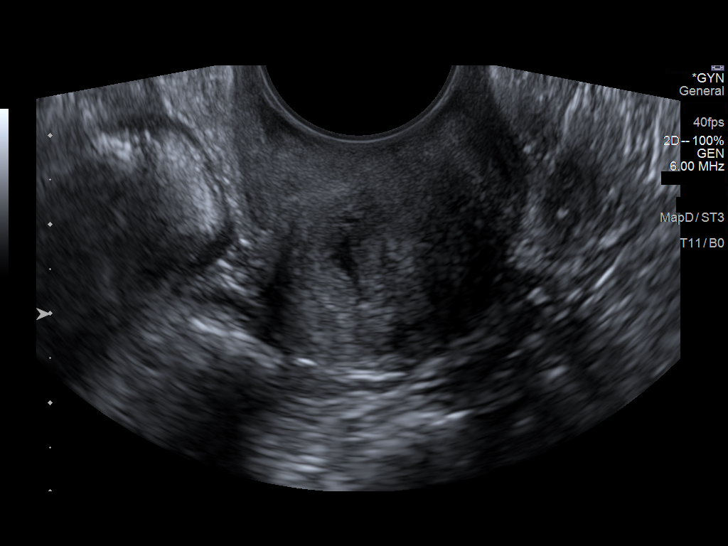
[im 25/50]
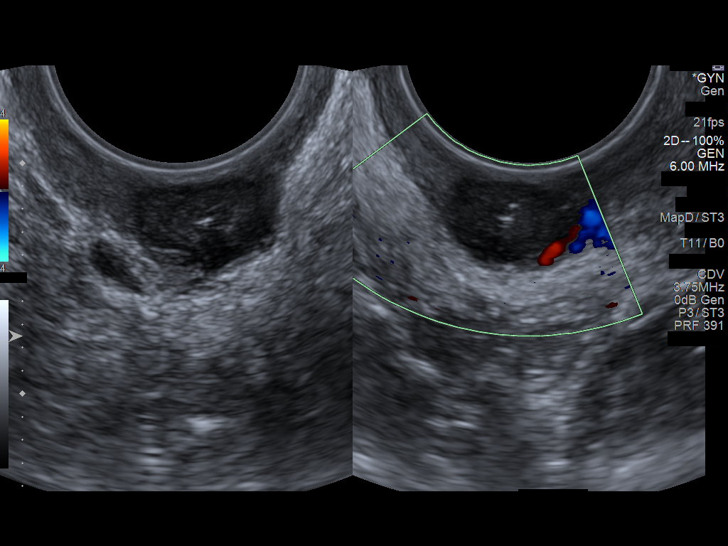
[im 29/50]
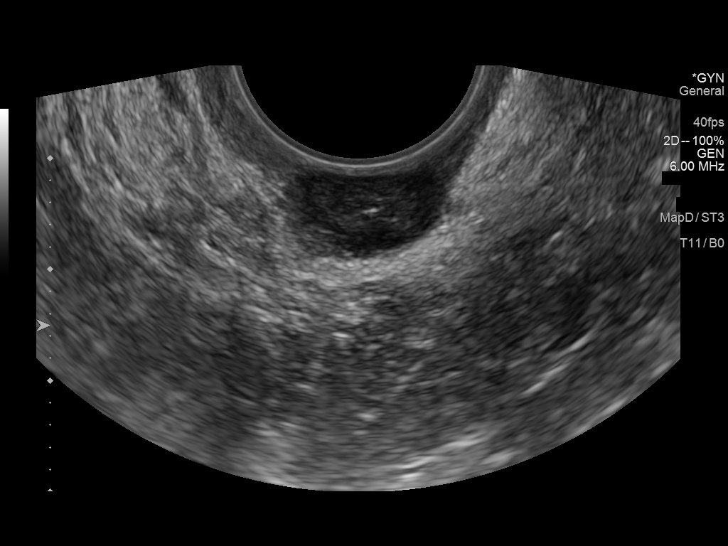
[im 33/50]
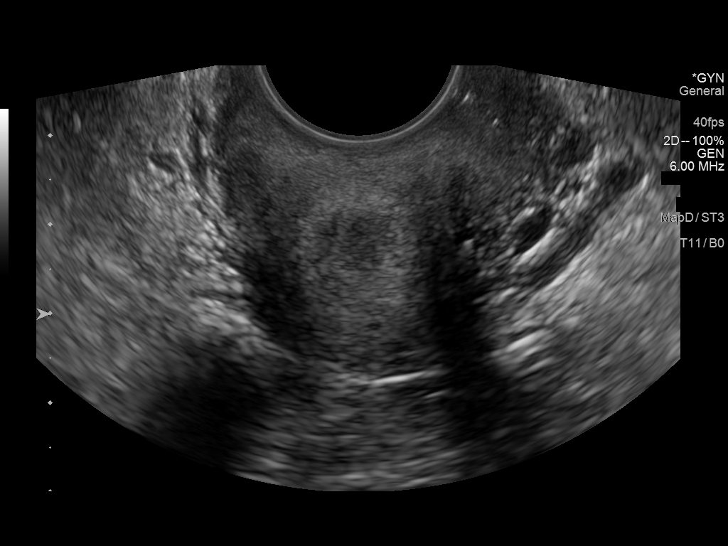
[im 37/50]
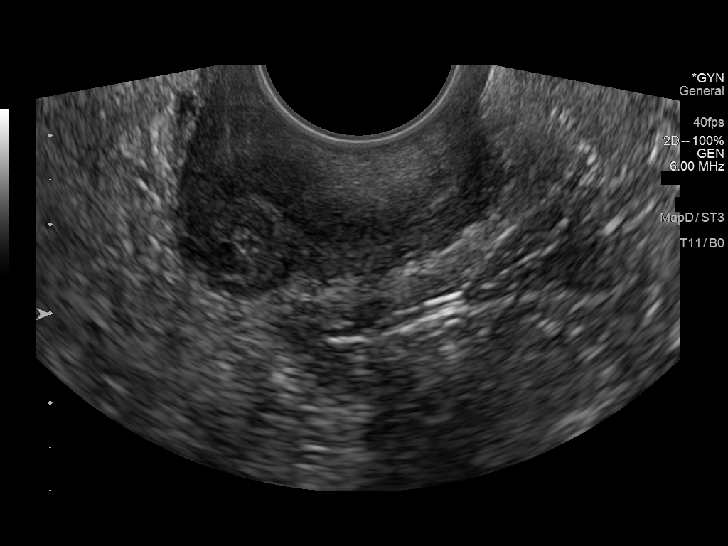
[im 41/50]
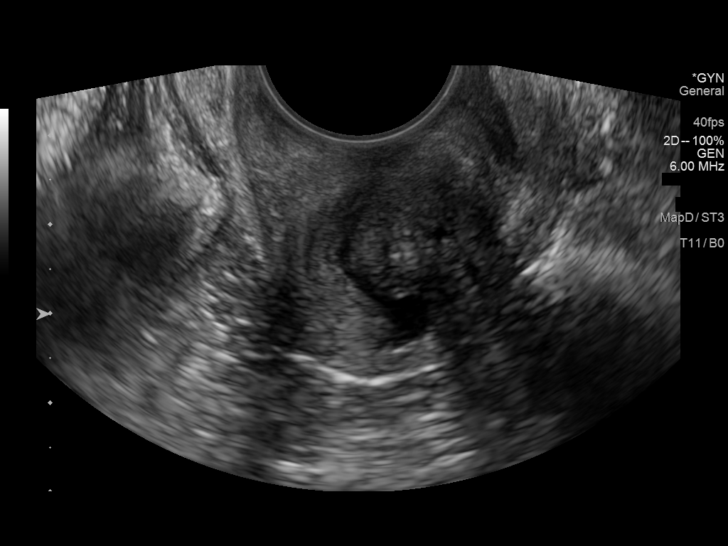
[im 45/50]
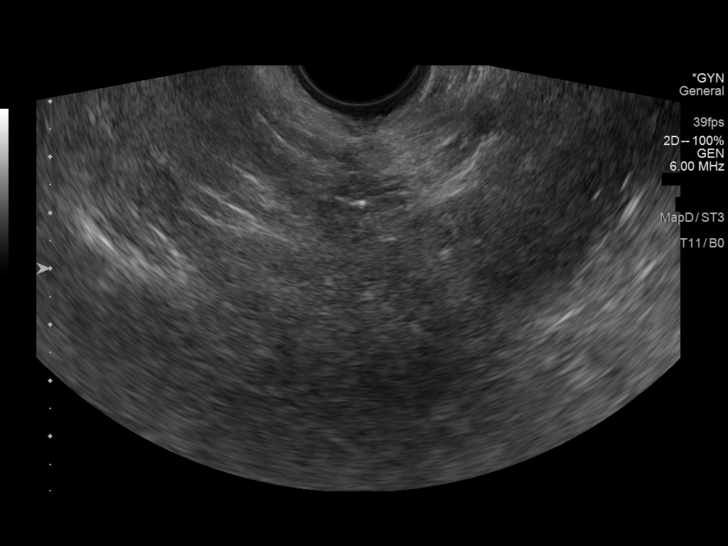
[im 50/50]
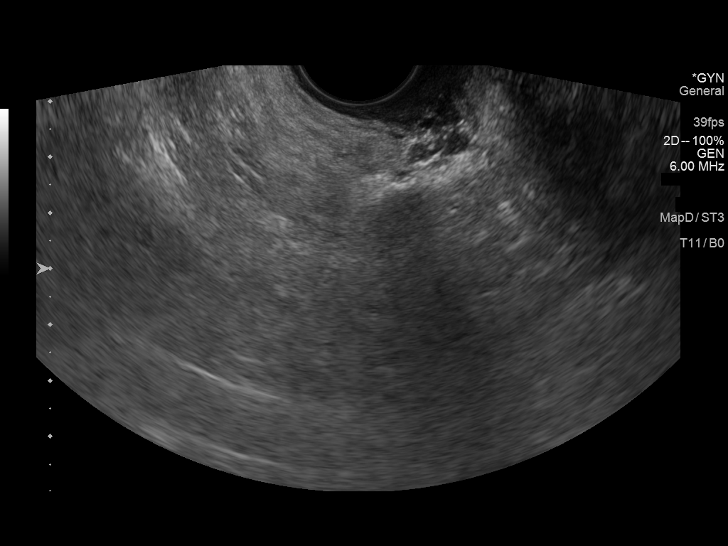

[13 of 25 positions shown; findings below may reference images not displayed]

FINDINGS: Uterus

Surgically absent

Endometrium

N/A

Right ovary

Not visualized, likely obscured by bowel

Left ovary

Not visualized, likely obscured by bowel

Other findings

No free pelvic fluid. No adnexal masses. Retained cervix identified,
containing a small hypoechoic nodule 12 x 8 x 20 mm in size is seen
at the cervix/vagina junction which has diffuse low level internal
echogenicity and several bright echogenic foci centrally. No
internal blood flow seen on color Doppler imaging. Potentially this
could represent a complicated nabothian cysts, suture granuloma or a
nonspecific complicated cystic or hypoechoic solid nodule.
IMPRESSION: Post hysterectomy with nonvisualization of ovaries; atrophic ovaries
were seen bilaterally on the recent CT.

12 x 8 x 20 mm complicated hypoechoic nodule with central echogenic
foci at the cervix/vagina junction, question complicated nabothian
cyst, suture granuloma, or other nonspecific hypovascular nodule;
recommend correlation with physical exam.

## 2021-02-25 DIAGNOSIS — M2560 Stiffness of unspecified joint, not elsewhere classified: Secondary | ICD-10-CM | POA: Diagnosis not present

## 2021-02-25 DIAGNOSIS — M5442 Lumbago with sciatica, left side: Secondary | ICD-10-CM | POA: Diagnosis not present

## 2021-02-25 DIAGNOSIS — R531 Weakness: Secondary | ICD-10-CM | POA: Diagnosis not present

## 2021-03-04 DIAGNOSIS — R531 Weakness: Secondary | ICD-10-CM | POA: Diagnosis not present

## 2021-03-04 DIAGNOSIS — M5442 Lumbago with sciatica, left side: Secondary | ICD-10-CM | POA: Diagnosis not present

## 2021-03-04 DIAGNOSIS — M2560 Stiffness of unspecified joint, not elsewhere classified: Secondary | ICD-10-CM | POA: Diagnosis not present

## 2021-03-11 DIAGNOSIS — R531 Weakness: Secondary | ICD-10-CM | POA: Diagnosis not present

## 2021-03-11 DIAGNOSIS — M2560 Stiffness of unspecified joint, not elsewhere classified: Secondary | ICD-10-CM | POA: Diagnosis not present

## 2021-03-11 DIAGNOSIS — Z713 Dietary counseling and surveillance: Secondary | ICD-10-CM | POA: Diagnosis not present

## 2021-03-11 DIAGNOSIS — M5442 Lumbago with sciatica, left side: Secondary | ICD-10-CM | POA: Diagnosis not present

## 2021-03-18 DIAGNOSIS — M2560 Stiffness of unspecified joint, not elsewhere classified: Secondary | ICD-10-CM | POA: Diagnosis not present

## 2021-03-18 DIAGNOSIS — M5442 Lumbago with sciatica, left side: Secondary | ICD-10-CM | POA: Diagnosis not present

## 2021-03-18 DIAGNOSIS — R531 Weakness: Secondary | ICD-10-CM | POA: Diagnosis not present

## 2021-04-07 DIAGNOSIS — Z79899 Other long term (current) drug therapy: Secondary | ICD-10-CM | POA: Diagnosis not present

## 2021-04-07 DIAGNOSIS — M791 Myalgia, unspecified site: Secondary | ICD-10-CM | POA: Diagnosis not present

## 2021-04-07 DIAGNOSIS — E785 Hyperlipidemia, unspecified: Secondary | ICD-10-CM | POA: Diagnosis not present

## 2021-04-27 ENCOUNTER — Other Ambulatory Visit: Payer: Self-pay | Admitting: Family Medicine

## 2021-04-27 DIAGNOSIS — E041 Nontoxic single thyroid nodule: Secondary | ICD-10-CM

## 2021-04-29 DIAGNOSIS — H04123 Dry eye syndrome of bilateral lacrimal glands: Secondary | ICD-10-CM | POA: Diagnosis not present

## 2021-04-29 DIAGNOSIS — H5213 Myopia, bilateral: Secondary | ICD-10-CM | POA: Diagnosis not present

## 2021-07-14 DIAGNOSIS — I8312 Varicose veins of left lower extremity with inflammation: Secondary | ICD-10-CM | POA: Diagnosis not present

## 2021-07-14 DIAGNOSIS — I788 Other diseases of capillaries: Secondary | ICD-10-CM | POA: Diagnosis not present

## 2021-07-14 DIAGNOSIS — I872 Venous insufficiency (chronic) (peripheral): Secondary | ICD-10-CM | POA: Diagnosis not present

## 2021-07-14 DIAGNOSIS — I8311 Varicose veins of right lower extremity with inflammation: Secondary | ICD-10-CM | POA: Diagnosis not present

## 2021-08-16 ENCOUNTER — Other Ambulatory Visit: Payer: Self-pay | Admitting: Family Medicine

## 2021-08-16 DIAGNOSIS — E041 Nontoxic single thyroid nodule: Secondary | ICD-10-CM

## 2021-08-16 DIAGNOSIS — Z0000 Encounter for general adult medical examination without abnormal findings: Secondary | ICD-10-CM | POA: Diagnosis not present

## 2021-08-16 DIAGNOSIS — E785 Hyperlipidemia, unspecified: Secondary | ICD-10-CM | POA: Diagnosis not present

## 2021-08-16 DIAGNOSIS — I10 Essential (primary) hypertension: Secondary | ICD-10-CM | POA: Diagnosis not present

## 2021-08-16 DIAGNOSIS — E559 Vitamin D deficiency, unspecified: Secondary | ICD-10-CM | POA: Diagnosis not present

## 2021-08-23 DIAGNOSIS — Z1231 Encounter for screening mammogram for malignant neoplasm of breast: Secondary | ICD-10-CM | POA: Diagnosis not present

## 2021-08-24 ENCOUNTER — Other Ambulatory Visit: Payer: Self-pay

## 2021-08-24 ENCOUNTER — Ambulatory Visit
Admission: RE | Admit: 2021-08-24 | Discharge: 2021-08-24 | Disposition: A | Discharge status: Home / Self Care | Payer: BC Managed Care – PPO | Source: Ambulatory Visit | Attending: Family Medicine | Admitting: Family Medicine

## 2021-08-24 DIAGNOSIS — E041 Nontoxic single thyroid nodule: Secondary | ICD-10-CM | POA: Diagnosis not present

## 2021-08-25 DIAGNOSIS — L918 Other hypertrophic disorders of the skin: Secondary | ICD-10-CM | POA: Diagnosis not present

## 2021-10-05 DIAGNOSIS — I700 Atherosclerosis of aorta: Secondary | ICD-10-CM | POA: Diagnosis not present

## 2021-10-05 DIAGNOSIS — M545 Low back pain, unspecified: Secondary | ICD-10-CM | POA: Diagnosis not present

## 2021-10-05 DIAGNOSIS — E785 Hyperlipidemia, unspecified: Secondary | ICD-10-CM | POA: Diagnosis not present

## 2021-10-05 DIAGNOSIS — F419 Anxiety disorder, unspecified: Secondary | ICD-10-CM | POA: Diagnosis not present

## 2022-01-13 IMAGING — US US THYROID
1 series · 13 of 25 positions shown · non-contrast
Comparison: 02/20/2020

CLINICAL DATA: Thyroid nodule follow-up

EXAM:
THYROID ULTRASOUND
TECHNIQUE: Ultrasound examination of the thyroid gland and adjacent soft
tissues was performed.

[Series 1: us thyroid · 0.06mm/px · 13 of 56 slices shown]
[im 1/56]
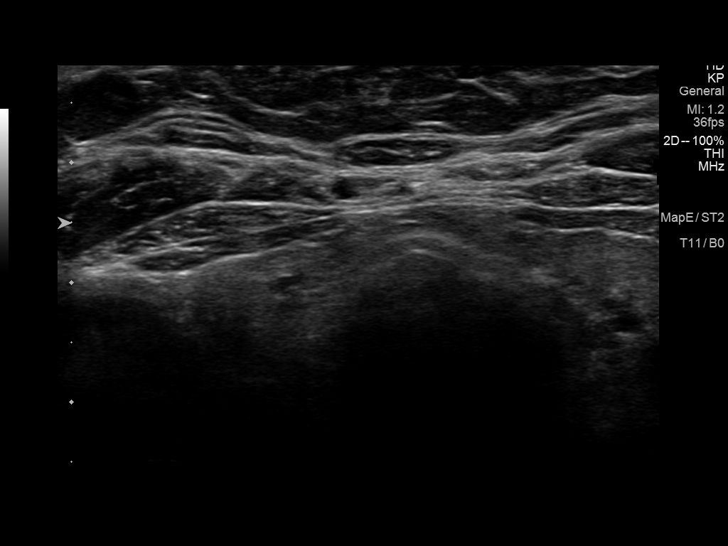
[im 5/56]
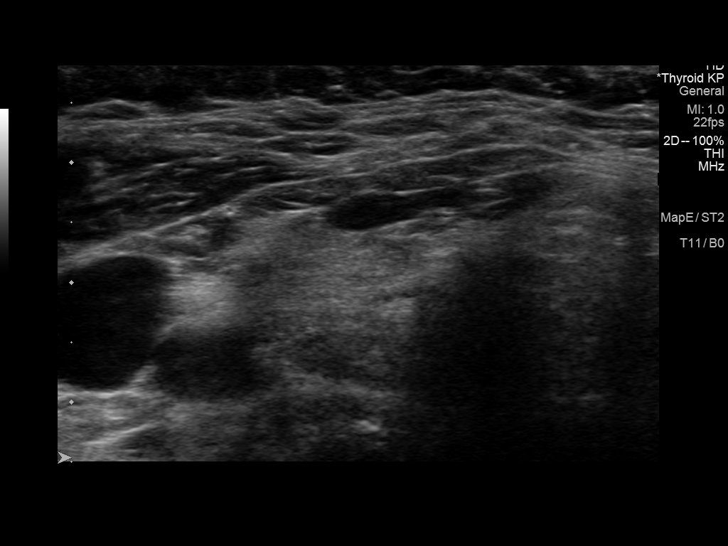
[im 10/56]
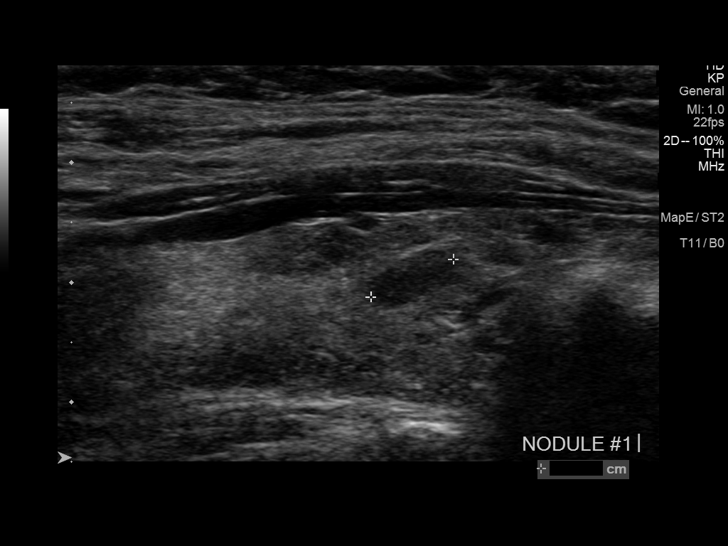
[im 14/56]
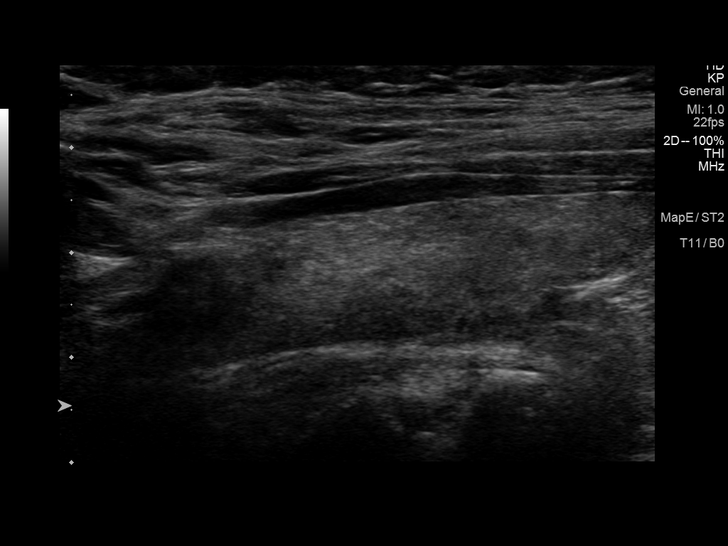
[im 19/56]
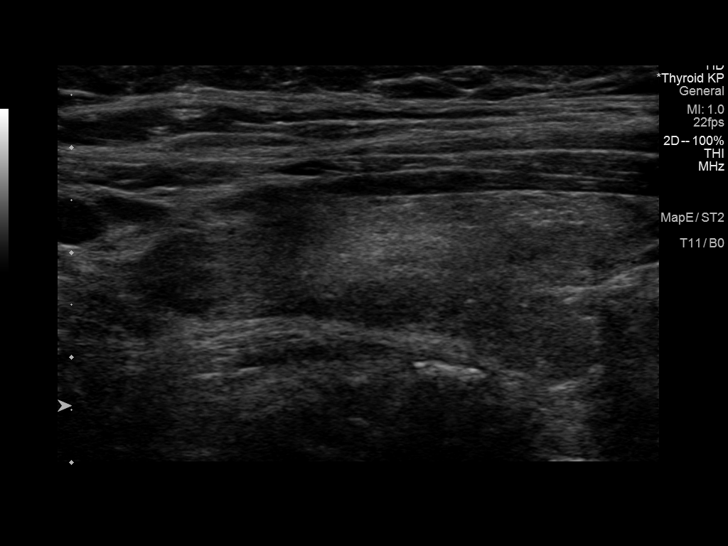
[im 23/56]
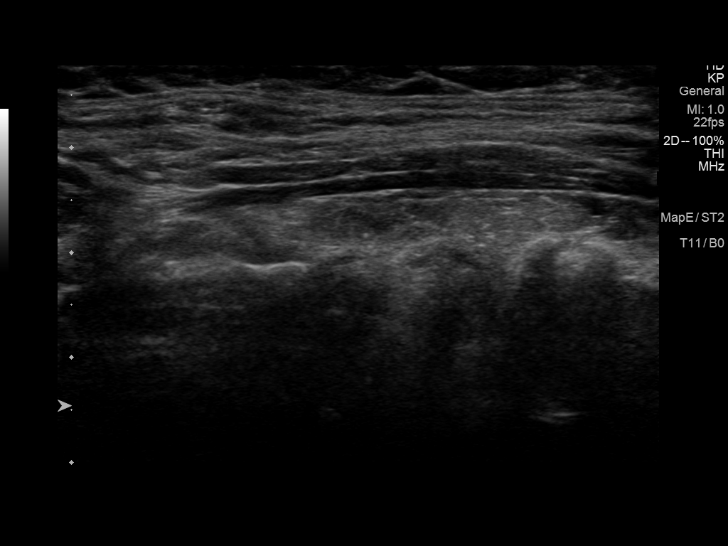
[im 28/56]
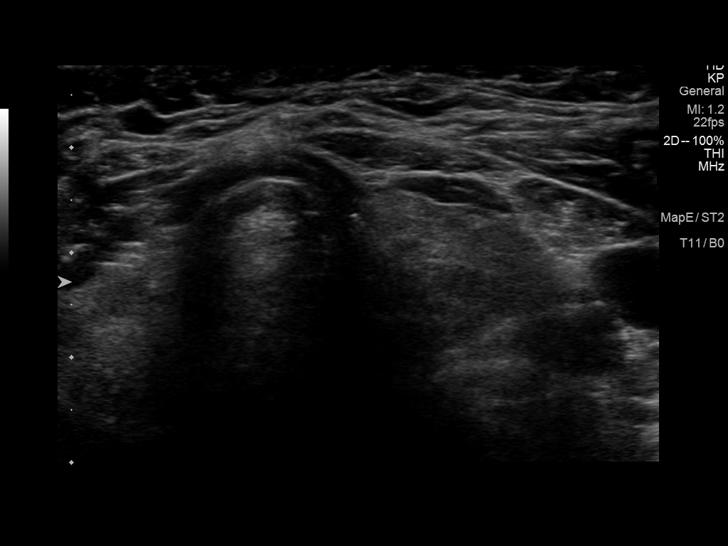
[im 33/56]
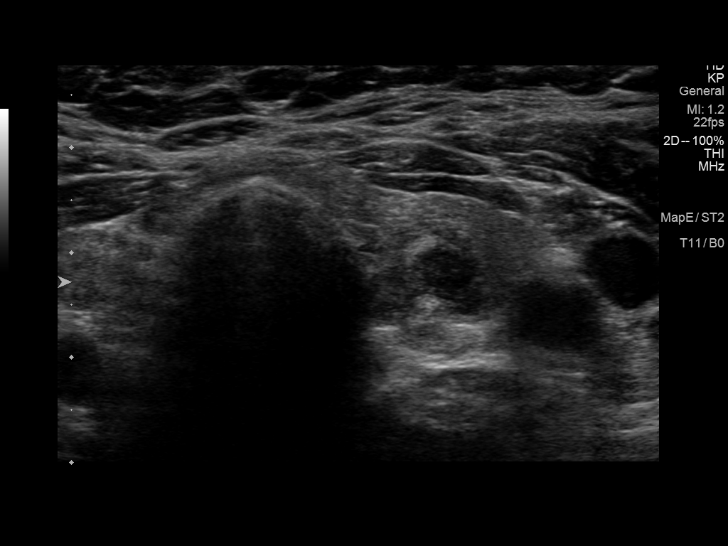
[im 37/56]
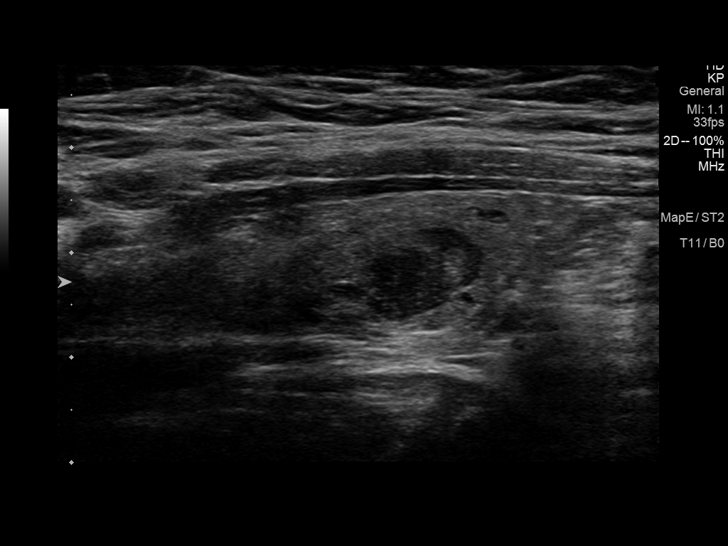
[im 42/56]
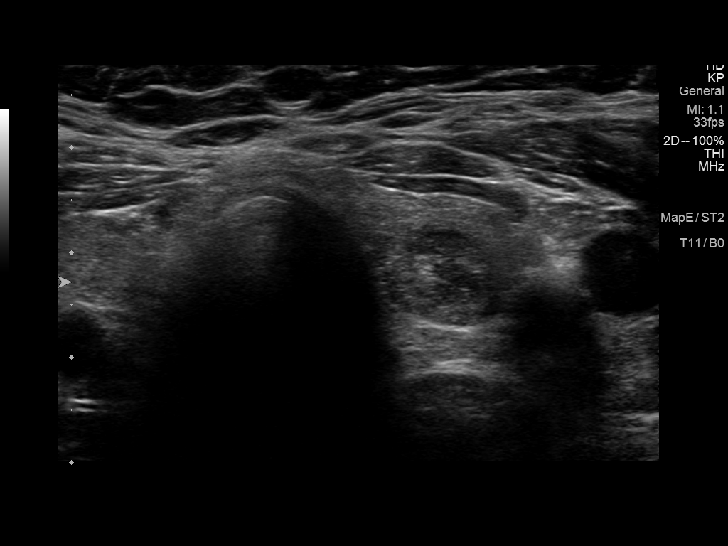
[im 46/56]
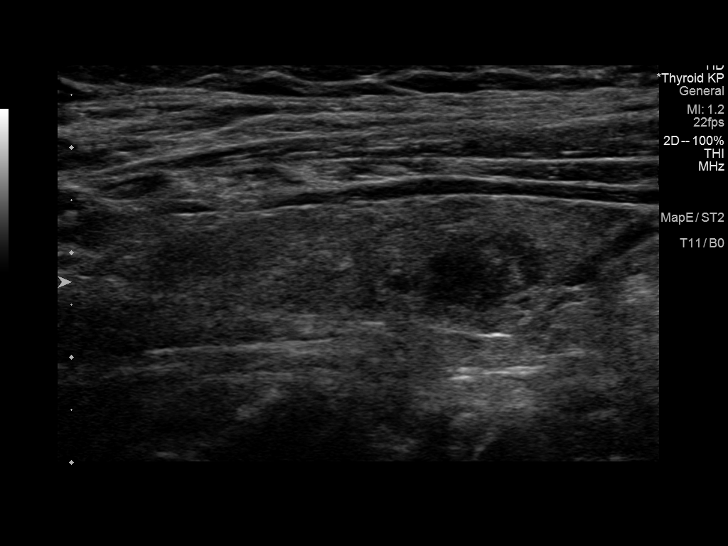
[im 51/56]
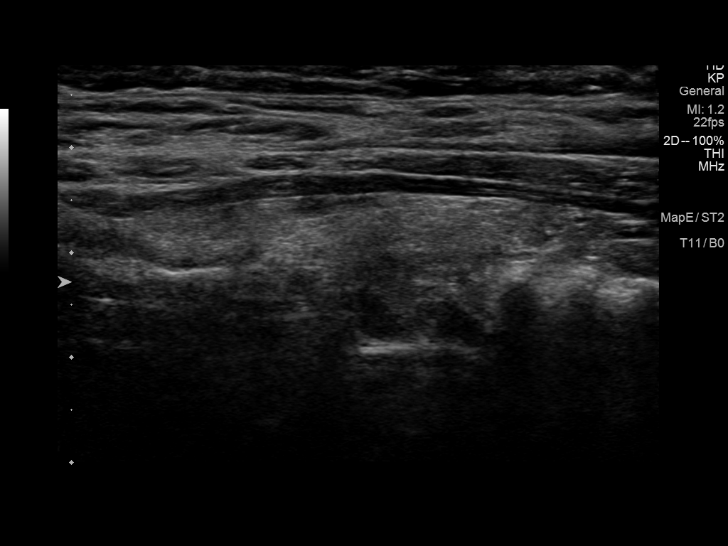
[im 56/56]
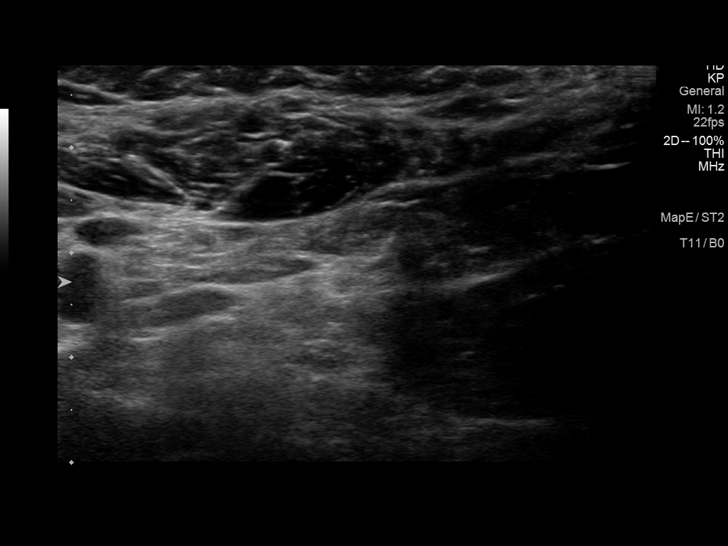

[13 of 25 positions shown; findings below may reference images not displayed]

FINDINGS: Parenchymal Echotexture: Mildly heterogeneous

Isthmus: 0.3 cm

Right lobe: 5.0 x 1.5 x 1.6 cm

Left lobe: 4.8 x 1.4 x 1.6 cm

_________________________________________________________

Estimated total number of nodules >/= 1 cm: 1

Number of spongiform nodules >/=  2 cm not described below (TR1): 0

Number of mixed cystic and solid nodules >/= 1.5 cm not described
below (TR2): 0

_________________________________________________________

Nodule # 1:

Prior biopsy: No

Location: Right; inferior

Maximum size: 0.8 cm; Other 2 dimensions: 0.6 x 0.3 cm, previously,
1.0 x 0.8 x 0.6 cm

Composition: solid/almost completely solid (2)

Echogenicity: hypoechoic (2)

Shape: not taller-than-wide (0)

Margins: smooth (0)

Echogenic foci: none (0)

ACR TI-RADS total points: 4.

ACR TI-RADS risk category:  TR4 (4-6 points).

Significant change in size (>/= 20% in two dimensions and minimal
increase of 2 mm): No

Change in features: No

Change in ACR TI-RADS risk category: No

ACR TI-RADS recommendations:

Given size (<0.9 cm) and appearance, this nodule does NOT meet
TI-RADS criteria for biopsy or dedicated follow-up.

_________________________________________________________

Nodule # 2:

Prior biopsy: No

Location: Left; inferior

Maximum size: 1.2 cm; Other 2 dimensions: 0.9 x 0.8 cm, previously,
1.2 x 0.9 x 0.8 cm

Composition: solid/almost completely solid (2)

Echogenicity: hypoechoic (2)

Shape: not taller-than-wide (0)

Margins: smooth (0)

Echogenic foci: none (0)

ACR TI-RADS total points: 4.

ACR TI-RADS risk category:  TR4 (4-6 points).

Significant change in size (>/= 20% in two dimensions and minimal
increase of 2 mm): No

Change in features: No

Change in ACR TI-RADS risk category: No

ACR TI-RADS recommendations:

*Given size (>/= 1 - 1.4 cm) and appearance, a follow-up ultrasound
in 1 year should be considered based on TI-RADS criteria.

_________________________________________________________
IMPRESSION: Nodule 2 (TI-RADS 4) located in the inferior left thyroid lobe is
not significantly changed in size since prior examination. This
nodule meets criteria for imaging follow-up. Annual ultrasound
surveillance is recommended until 5 years of stability is
documented.

The above is in keeping with the ACR TI-RADS recommendations - [HOSPITAL] 0489;[DATE].

## 2022-01-20 ENCOUNTER — Other Ambulatory Visit: Payer: Self-pay | Admitting: Gastroenterology

## 2022-01-20 DIAGNOSIS — R109 Unspecified abdominal pain: Secondary | ICD-10-CM

## 2022-01-20 DIAGNOSIS — K219 Gastro-esophageal reflux disease without esophagitis: Secondary | ICD-10-CM | POA: Diagnosis not present

## 2022-01-20 DIAGNOSIS — K921 Melena: Secondary | ICD-10-CM | POA: Diagnosis not present

## 2022-01-20 DIAGNOSIS — R195 Other fecal abnormalities: Secondary | ICD-10-CM | POA: Diagnosis not present

## 2022-01-20 DIAGNOSIS — R1084 Generalized abdominal pain: Secondary | ICD-10-CM | POA: Diagnosis not present

## 2022-01-28 DIAGNOSIS — R7309 Other abnormal glucose: Secondary | ICD-10-CM | POA: Diagnosis not present

## 2022-02-01 ENCOUNTER — Other Ambulatory Visit: Payer: Self-pay | Admitting: Gastroenterology

## 2022-02-01 DIAGNOSIS — R109 Unspecified abdominal pain: Secondary | ICD-10-CM

## 2022-02-02 DIAGNOSIS — D485 Neoplasm of uncertain behavior of skin: Secondary | ICD-10-CM | POA: Diagnosis not present

## 2022-02-02 DIAGNOSIS — L814 Other melanin hyperpigmentation: Secondary | ICD-10-CM | POA: Diagnosis not present

## 2022-02-02 DIAGNOSIS — L720 Epidermal cyst: Secondary | ICD-10-CM | POA: Diagnosis not present

## 2022-02-02 DIAGNOSIS — R195 Other fecal abnormalities: Secondary | ICD-10-CM | POA: Diagnosis not present

## 2022-02-02 DIAGNOSIS — D2261 Melanocytic nevi of right upper limb, including shoulder: Secondary | ICD-10-CM | POA: Diagnosis not present

## 2022-02-02 DIAGNOSIS — L821 Other seborrheic keratosis: Secondary | ICD-10-CM | POA: Diagnosis not present

## 2022-02-02 DIAGNOSIS — D2262 Melanocytic nevi of left upper limb, including shoulder: Secondary | ICD-10-CM | POA: Diagnosis not present

## 2022-02-13 ENCOUNTER — Ambulatory Visit
Admission: RE | Admit: 2022-02-13 | Discharge: 2022-02-13 | Disposition: A | Discharge status: Home / Self Care | Payer: BC Managed Care – PPO | Source: Ambulatory Visit | Attending: Gastroenterology | Admitting: Gastroenterology

## 2022-02-13 ENCOUNTER — Other Ambulatory Visit: Payer: BC Managed Care – PPO

## 2022-02-13 DIAGNOSIS — R109 Unspecified abdominal pain: Secondary | ICD-10-CM | POA: Diagnosis not present

## 2022-02-13 DIAGNOSIS — K7689 Other specified diseases of liver: Secondary | ICD-10-CM | POA: Diagnosis not present

## 2022-02-21 DIAGNOSIS — F419 Anxiety disorder, unspecified: Secondary | ICD-10-CM | POA: Diagnosis not present

## 2022-02-21 DIAGNOSIS — E785 Hyperlipidemia, unspecified: Secondary | ICD-10-CM | POA: Diagnosis not present

## 2022-02-21 DIAGNOSIS — I700 Atherosclerosis of aorta: Secondary | ICD-10-CM | POA: Diagnosis not present

## 2022-02-21 DIAGNOSIS — I10 Essential (primary) hypertension: Secondary | ICD-10-CM | POA: Diagnosis not present

## 2022-02-27 DIAGNOSIS — R7309 Other abnormal glucose: Secondary | ICD-10-CM | POA: Diagnosis not present

## 2022-03-23 DIAGNOSIS — H43812 Vitreous degeneration, left eye: Secondary | ICD-10-CM | POA: Diagnosis not present

## 2022-03-24 DIAGNOSIS — M1712 Unilateral primary osteoarthritis, left knee: Secondary | ICD-10-CM | POA: Diagnosis not present

## 2022-03-24 DIAGNOSIS — M170 Bilateral primary osteoarthritis of knee: Secondary | ICD-10-CM | POA: Diagnosis not present

## 2022-03-24 DIAGNOSIS — M1711 Unilateral primary osteoarthritis, right knee: Secondary | ICD-10-CM | POA: Diagnosis not present

## 2022-03-30 DIAGNOSIS — R7309 Other abnormal glucose: Secondary | ICD-10-CM | POA: Diagnosis not present

## 2022-04-28 DIAGNOSIS — H43812 Vitreous degeneration, left eye: Secondary | ICD-10-CM | POA: Diagnosis not present

## 2022-04-29 DIAGNOSIS — R7309 Other abnormal glucose: Secondary | ICD-10-CM | POA: Diagnosis not present

## 2022-05-15 DIAGNOSIS — H524 Presbyopia: Secondary | ICD-10-CM | POA: Diagnosis not present

## 2022-05-15 DIAGNOSIS — H25813 Combined forms of age-related cataract, bilateral: Secondary | ICD-10-CM | POA: Diagnosis not present

## 2022-05-15 DIAGNOSIS — D3132 Benign neoplasm of left choroid: Secondary | ICD-10-CM | POA: Diagnosis not present

## 2022-05-30 DIAGNOSIS — R7309 Other abnormal glucose: Secondary | ICD-10-CM | POA: Diagnosis not present

## 2022-06-27 ENCOUNTER — Other Ambulatory Visit: Payer: Self-pay

## 2022-06-27 ENCOUNTER — Emergency Department (HOSPITAL_COMMUNITY)
Admission: EM | Admit: 2022-06-27 | Discharge: 2022-06-27 | Disposition: A | Discharge status: Home / Self Care | Payer: BC Managed Care – PPO | Attending: Student | Admitting: Student

## 2022-06-27 ENCOUNTER — Encounter (HOSPITAL_COMMUNITY): Payer: Self-pay

## 2022-06-27 DIAGNOSIS — M79605 Pain in left leg: Secondary | ICD-10-CM

## 2022-06-27 DIAGNOSIS — I10 Essential (primary) hypertension: Secondary | ICD-10-CM | POA: Insufficient documentation

## 2022-06-27 DIAGNOSIS — Z91040 Latex allergy status: Secondary | ICD-10-CM | POA: Diagnosis not present

## 2022-06-27 DIAGNOSIS — M79662 Pain in left lower leg: Secondary | ICD-10-CM | POA: Diagnosis not present

## 2022-06-27 DIAGNOSIS — R0602 Shortness of breath: Secondary | ICD-10-CM | POA: Insufficient documentation

## 2022-06-27 NOTE — ED Triage Notes (Addendum)
Pt reports with left leg pain and redness x 1 month ago. Pt reports having venous insufficiency and lymphedema. Pt reports getting "more winded" than usual.

## 2022-06-27 NOTE — ED Provider Notes (Signed)
[facility]-EMERGENCY DEPT Provider Note   CSN: [id number] Arrival date & time: 06/27/22  1932     History  Chief Complaint  Patient presents with   Leg Pain    Jill Palmer is a 56 y.o. female.  Patient presents complaining of lower left leg pain.  Patient states this has been increasing over the past 2 to 3 weeks.  She does have a history of lymphedema and venous stasis but states this pain feels different.  She has seen her primary care who recommended that she get a DVT study.  She was sent here tonight for a DVT study of the left lower extremity.  She does endorse mild shortness of breath with exertion but denies chest pain.  Past medical history significant for hypertension, edema, venous stasis, lymphedema, GERD, hiatal hernia, thyroid disease, palpitations, history of chest pain, osteoarthritis, back pain, anxiety, shortness of breath on exertion, obesity (BMI greater than 55)  HPI     Home Medications Prior to Admission medications   Medication Sig Start Date End Date Taking? Authorizing Provider  b complex vitamins tablet Take 1 tablet by mouth daily as needed.    [provider]  calcium carbonate (OS-CAL) 600 MG TABS Take 600 mg by mouth every morning.     [provider]  Multiple Vitamin (MULTIVITAMIN WITH MINERALS) TABS Take 1 tablet by mouth every morning.     [provider]  triamterene-hydrochlorothiazide (MAXZIDE-25) 37.5-25 MG per tablet Take 1 tablet every morning by mouth. For fluid    [provider]  vitamin C (ASCORBIC ACID) 500 MG tablet Take 500 mg by mouth daily as needed.    [provider]  Vitamin D, Ergocalciferol, (DRISDOL) 1.25 MG (50000 UT) CAPS capsule Take 1 capsule (50,000 Units total) by mouth every 7 (seven) days. 10/10/18   Alois Cliche, PA-C      Allergies    Septra [sulfamethoxazole-trimethoprim], Latex, Penicillins, Tessalon perles [benzonatate], and Meloxicam     Review of Systems   Review of Systems  Cardiovascular:  Positive for leg swelling.  Musculoskeletal:  Positive for myalgias.    Physical Exam Updated Vital Signs BP 137/88 (BP Location: Right Arm)   Pulse 77   Temp 98.5 F (36.9 C) (Oral)   Resp 14   Ht 5\' 5"  (1.651 m)   Wt (!) 156.5 kg   SpO2 100%   BMI 57.41 kg/m  Physical Exam Vitals and nursing note reviewed.  Constitutional:      General: She is not in acute distress.    Appearance: She is obese.  HENT:     Head: Normocephalic and atraumatic.     Mouth/Throat:     Mouth: Mucous membranes are moist.  Eyes:     Conjunctiva/sclera: Conjunctivae normal.  Cardiovascular:     Rate and Rhythm: Normal rate.     Pulses: Normal pulses.  Pulmonary:     Effort: Pulmonary effort is normal.  Musculoskeletal:        General: Tenderness (Tenderness to palpation of the left calf muscle) present. Normal range of motion.     Cervical back: Normal range of motion and neck supple.  Skin:    General: Skin is warm and dry.     Capillary Refill: Capillary refill takes less than 2 seconds.  Neurological:     Mental Status: She is alert and oriented to person, place, and time.     ED Results / Procedures / Treatments   Labs (all  labs ordered are listed, but only abnormal results are displayed) Labs Reviewed - No data to display  EKG None  Radiology No results found.  Procedures Procedures    Medications Ordered in ED Medications - No data to display  ED Course/ Medical Decision Making/ A&P                           Medical Decision Making  Patient presents for evaluation of possible DVT with lower left leg pain.  Differential includes but is not limited to venous stasis, lymphedema, DVT, and others  Patient denies recent surgery, personal cancer history, recent long trips.  She does have significant swelling to both lower extremities but the left seems slightly larger than the right at this time.  Unable to rule  out DVT at this time.  I do believe a DVT study is warranted but unfortunately it is too late to have 1 at this facility tonight.  I did discuss a Lovenox injection with the patient tonight in case there was a DVT for prophylaxis until she could have a study tomorrow but the patient declined at this time.  This is reasonable.  Risks of not having anticoagulation were discussed.  DVT study order placed for outpatient for tomorrow.  Patient will be provided with instructions on where to go and when for the study.  Patient voices understanding.  Discharge home at this time.        Final Clinical Impression(s) / ED Diagnoses Final diagnoses:  Left leg pain    Rx / DC Orders ED Discharge Orders          Ordered    LE VENOUS  Status:  Canceled        06/27/22 2014    LE VENOUS        06/27/22 2015              Darrick Grinder, PA-C 06/27/22 2019    Mardene Sayer, MD 06/27/22 2337

## 2022-06-27 NOTE — Discharge Instructions (Signed)
IMPORTANT PATIENT INSTRUCTIONS:  You have been scheduled for an Outpatient Vascular Study at [facility].    If tomorrow is a Saturday, Sunday or holiday, please go to the [facility] Registration Desk at 11 am tomorrow morning and tell them you are there for a vascular study.   If tomorrow is a weekday (Monday-Friday), please go to [facility] Registration at 11 am and tell them you are there for a vascular study. 

## 2022-06-28 ENCOUNTER — Ambulatory Visit (HOSPITAL_COMMUNITY)
Admission: RE | Admit: 2022-06-28 | Discharge: 2022-06-28 | Disposition: A | Discharge status: Home / Self Care | Payer: BC Managed Care – PPO | Source: Ambulatory Visit | Attending: Student | Admitting: Student

## 2022-06-28 DIAGNOSIS — R52 Pain, unspecified: Secondary | ICD-10-CM

## 2022-06-28 DIAGNOSIS — M79605 Pain in left leg: Secondary | ICD-10-CM | POA: Diagnosis not present

## 2022-06-28 NOTE — Progress Notes (Signed)
Left LE venous duplex study completed. Please see CV Proc for preliminary results.  Aury Scollard BS, RVT 06/28/2022 11:31 AM

## 2022-06-30 DIAGNOSIS — R7309 Other abnormal glucose: Secondary | ICD-10-CM | POA: Diagnosis not present

## 2022-07-05 IMAGING — US US ABDOMEN COMPLETE
1 series · 14 of 25 positions shown · non-contrast
Comparison: CT abdomen pelvis September 01, 2020

CLINICAL DATA: Abdomen pain and cramping for 2 months.

EXAM:
ABDOMEN ULTRASOUND COMPLETE

[Series 1: us abdomen complete · 0.33mm/px · 14 of 80 slices shown]
[im 1/80]
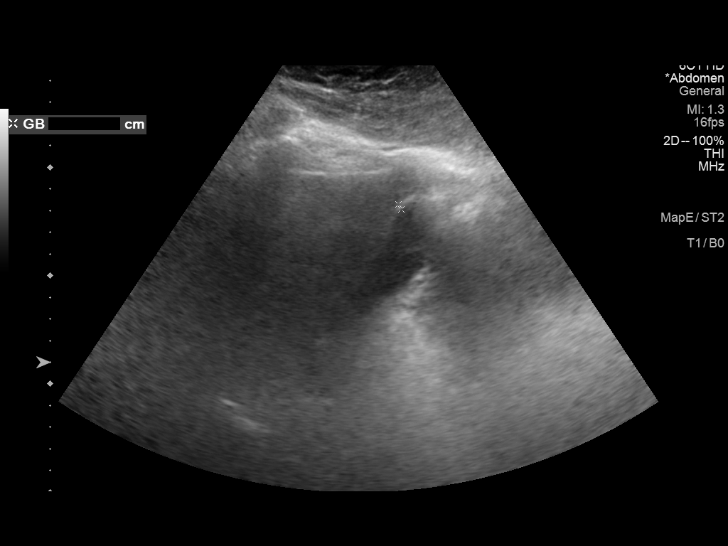
[im 7/80]
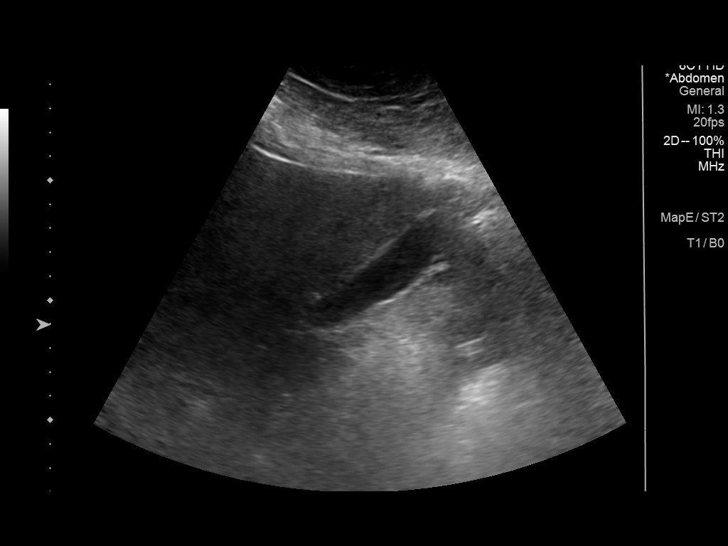
[im 14/80]
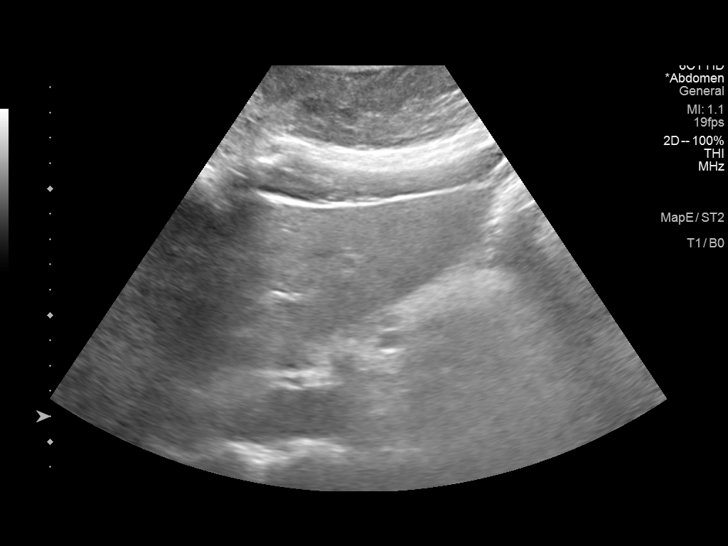
[im 20/80]
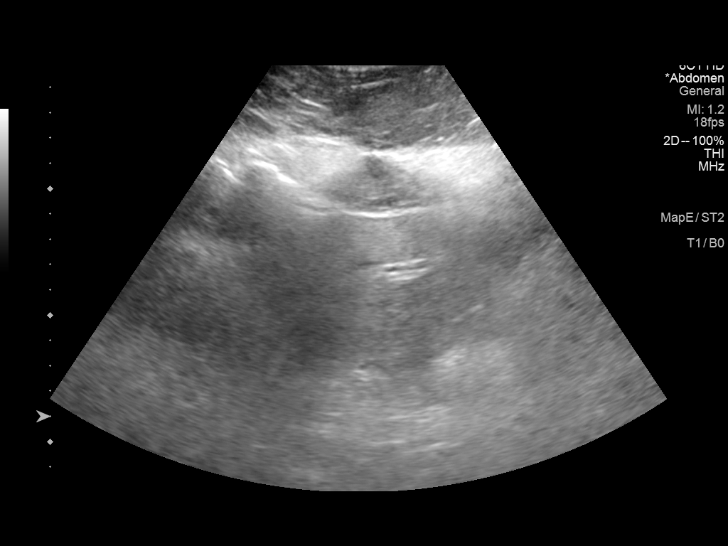
[im 27/80]
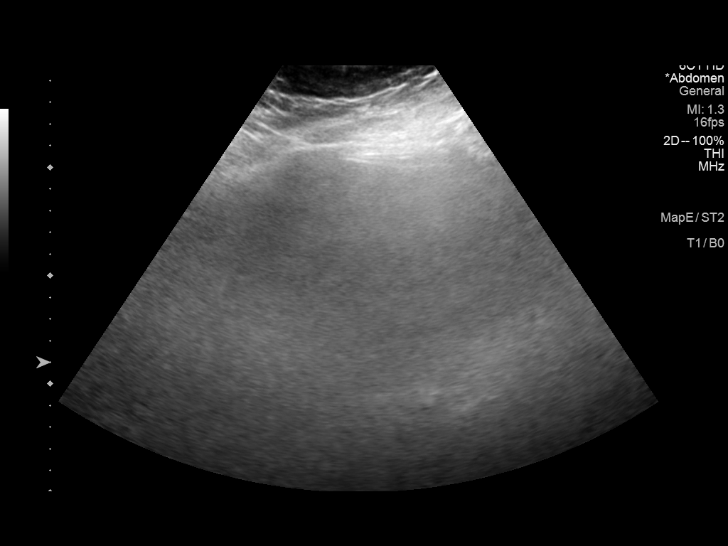
[im 30/80]
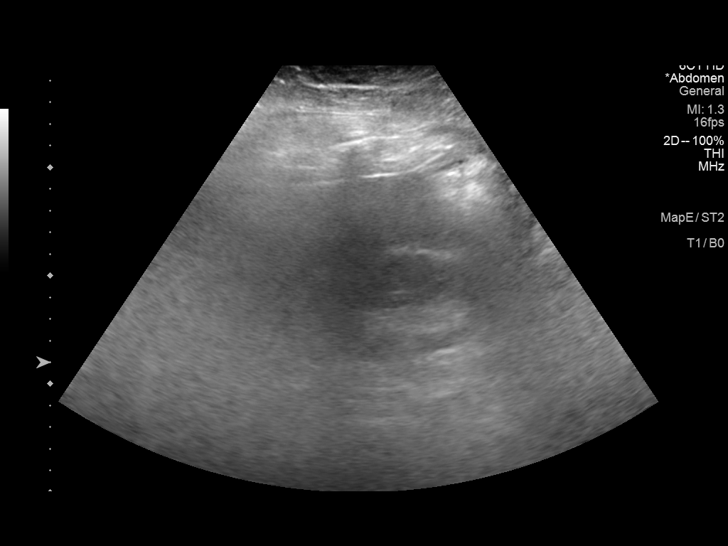
[im 37/80]
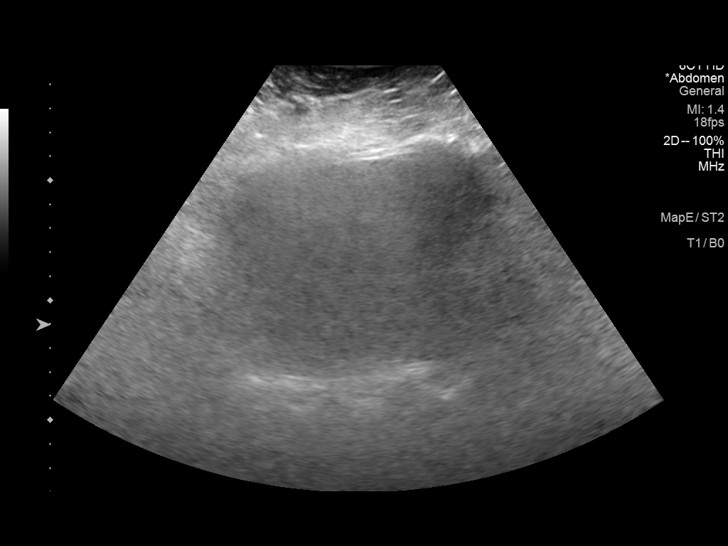
[im 43/80]
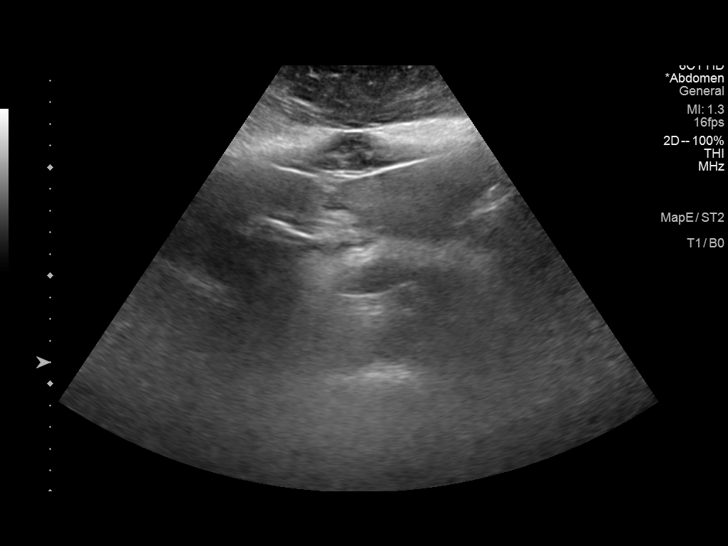
[im 50/80]
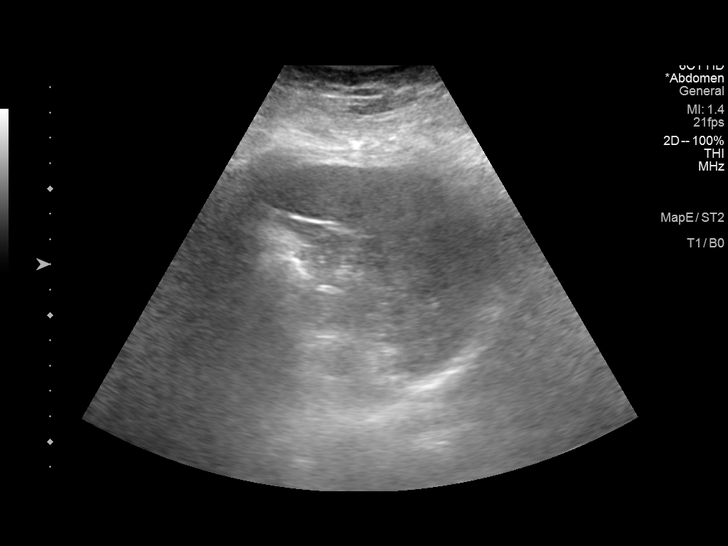
[im 53/80]
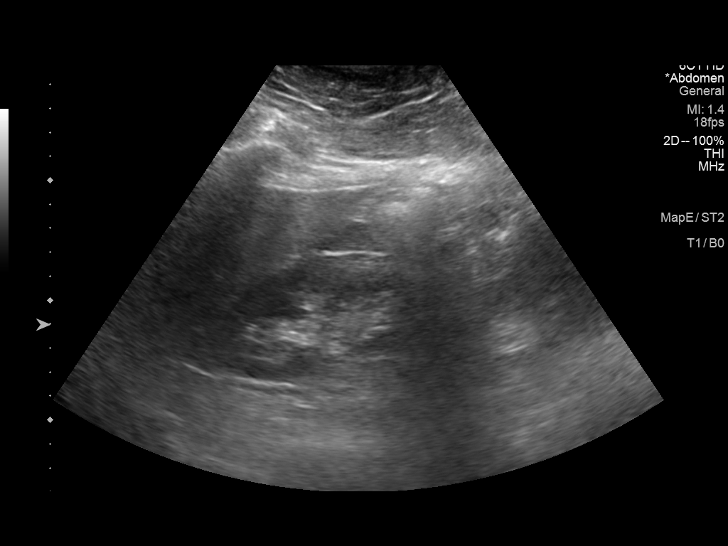
[im 60/80]
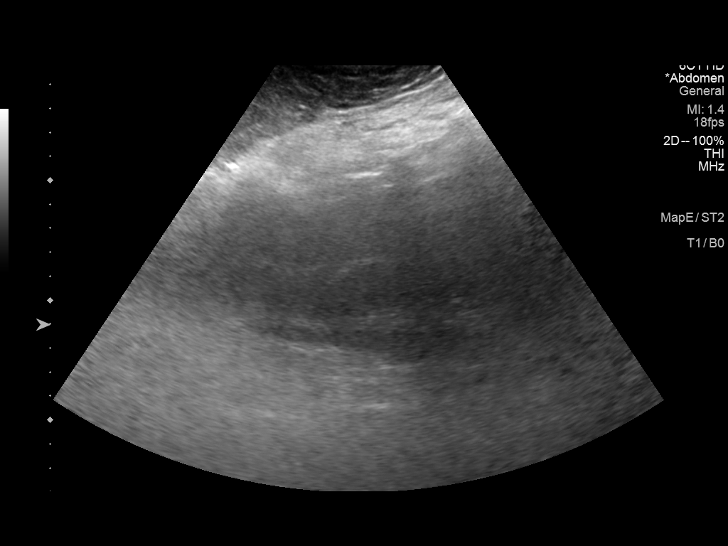
[im 66/80]
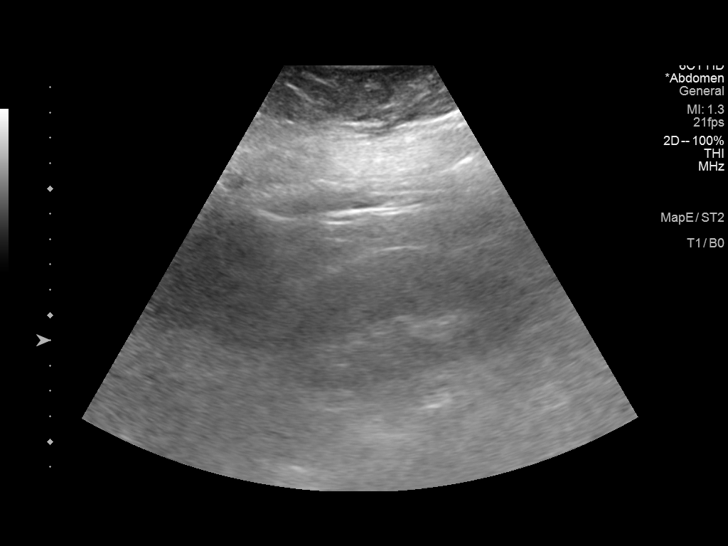
[im 73/80]
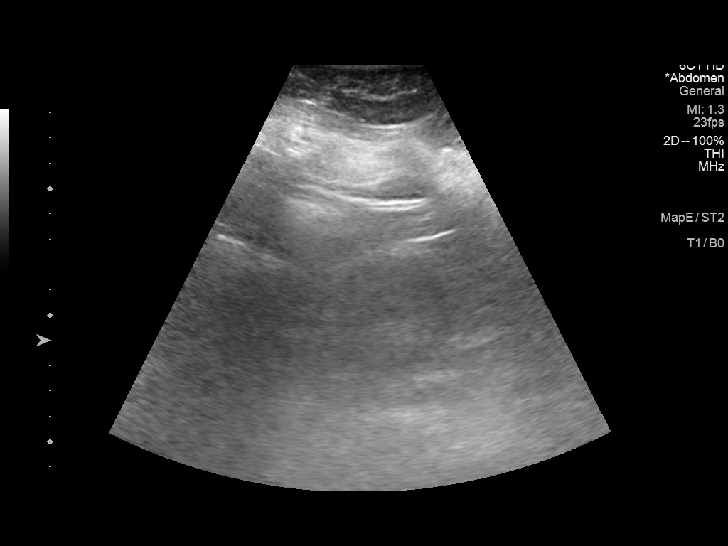
[im 80/80]
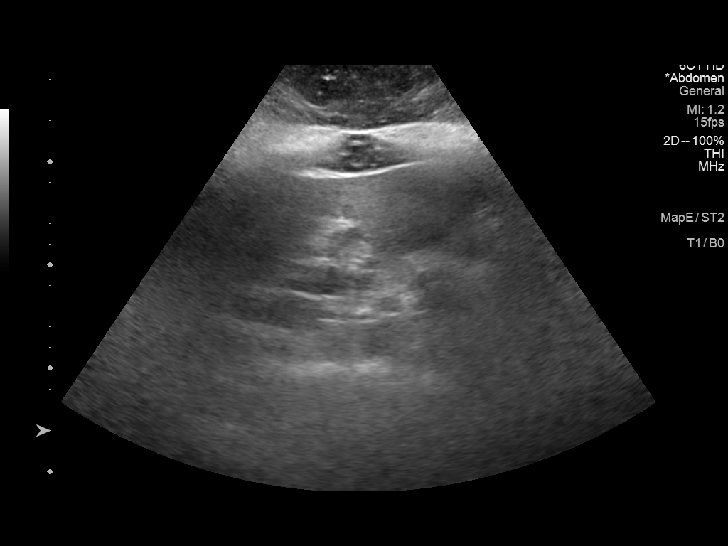

[14 of 25 positions shown; findings below may reference images not displayed]

FINDINGS: Gallbladder: No gallstones or wall thickening visualized. No
sonographic Murphy sign noted by sonographer.

Common bile duct: Diameter: 4.4 mm

Liver: No focal lesion identified. Diffuse increased echotexture of
the liver is noted. Portal vein is patent on color Doppler imaging
with normal direction of blood flow towards the liver.

IVC: No abnormality visualized.

Pancreas: Visualized portion unremarkable.

Spleen: Size and appearance within normal limits.

Right Kidney: Length: 10.3 cm. Echogenicity within normal limits. No
mass or hydronephrosis visualized.

Left Kidney: Length: 10.8 cm. Echogenicity within normal limits. No
mass or hydronephrosis visualized.

Abdominal aorta: No aneurysm visualized.

Other findings: None.
IMPRESSION: Diffuse increased echotexture of the liver consistent with fatty
infiltration liver. No acute abnormality identified.

## 2022-07-30 DIAGNOSIS — R7309 Other abnormal glucose: Secondary | ICD-10-CM | POA: Diagnosis not present

## 2022-08-23 ENCOUNTER — Other Ambulatory Visit: Payer: Self-pay | Admitting: Family Medicine

## 2022-08-23 DIAGNOSIS — E559 Vitamin D deficiency, unspecified: Secondary | ICD-10-CM | POA: Diagnosis not present

## 2022-08-23 DIAGNOSIS — K219 Gastro-esophageal reflux disease without esophagitis: Secondary | ICD-10-CM | POA: Diagnosis not present

## 2022-08-23 DIAGNOSIS — K760 Fatty (change of) liver, not elsewhere classified: Secondary | ICD-10-CM | POA: Diagnosis not present

## 2022-08-23 DIAGNOSIS — Z0000 Encounter for general adult medical examination without abnormal findings: Secondary | ICD-10-CM | POA: Diagnosis not present

## 2022-08-23 DIAGNOSIS — E785 Hyperlipidemia, unspecified: Secondary | ICD-10-CM | POA: Diagnosis not present

## 2022-08-23 DIAGNOSIS — E041 Nontoxic single thyroid nodule: Secondary | ICD-10-CM

## 2022-08-23 DIAGNOSIS — I10 Essential (primary) hypertension: Secondary | ICD-10-CM | POA: Diagnosis not present

## 2022-08-23 DIAGNOSIS — I700 Atherosclerosis of aorta: Secondary | ICD-10-CM | POA: Diagnosis not present

## 2022-08-24 DIAGNOSIS — Z1231 Encounter for screening mammogram for malignant neoplasm of breast: Secondary | ICD-10-CM | POA: Diagnosis not present

## 2022-08-30 DIAGNOSIS — R7309 Other abnormal glucose: Secondary | ICD-10-CM | POA: Diagnosis not present

## 2022-09-08 ENCOUNTER — Ambulatory Visit
Admission: RE | Admit: 2022-09-08 | Discharge: 2022-09-08 | Disposition: A | Discharge status: Home / Self Care | Payer: BC Managed Care – PPO | Source: Ambulatory Visit | Attending: Family Medicine | Admitting: Family Medicine

## 2022-09-08 DIAGNOSIS — R92322 Mammographic fibroglandular density, left breast: Secondary | ICD-10-CM | POA: Diagnosis not present

## 2022-09-08 DIAGNOSIS — E041 Nontoxic single thyroid nodule: Secondary | ICD-10-CM | POA: Diagnosis not present

## 2022-09-08 DIAGNOSIS — R921 Mammographic calcification found on diagnostic imaging of breast: Secondary | ICD-10-CM | POA: Diagnosis not present

## 2022-09-15 DIAGNOSIS — R921 Mammographic calcification found on diagnostic imaging of breast: Secondary | ICD-10-CM | POA: Diagnosis not present

## 2022-09-15 DIAGNOSIS — Z170 Estrogen receptor positive status [ER+]: Secondary | ICD-10-CM | POA: Diagnosis not present

## 2022-09-15 DIAGNOSIS — D0512 Intraductal carcinoma in situ of left breast: Secondary | ICD-10-CM | POA: Diagnosis not present

## 2022-09-15 DIAGNOSIS — R920 Mammographic microcalcification found on diagnostic imaging of breast: Secondary | ICD-10-CM | POA: Diagnosis not present

## 2022-09-22 ENCOUNTER — Telehealth: Payer: Self-pay | Admitting: Hematology and Oncology

## 2022-09-22 DIAGNOSIS — D0512 Intraductal carcinoma in situ of left breast: Secondary | ICD-10-CM | POA: Diagnosis not present

## 2022-09-22 NOTE — Telephone Encounter (Signed)
Scheduled appointment per referral. Patient is aware of appointment date and time. Patient is aware to arrive 15 mins prior to appointment time and to bring updated insurance cards. Patient is aware of location.   

## 2022-09-29 DIAGNOSIS — R7309 Other abnormal glucose: Secondary | ICD-10-CM | POA: Diagnosis not present

## 2022-10-02 DIAGNOSIS — D0512 Intraductal carcinoma in situ of left breast: Secondary | ICD-10-CM | POA: Diagnosis not present

## 2022-10-04 ENCOUNTER — Other Ambulatory Visit: Payer: Self-pay

## 2022-10-04 ENCOUNTER — Inpatient Hospital Stay: Payer: BC Managed Care – PPO

## 2022-10-04 ENCOUNTER — Inpatient Hospital Stay: Payer: BC Managed Care – PPO | Attending: Hematology and Oncology | Admitting: Hematology and Oncology

## 2022-10-04 ENCOUNTER — Encounter: Payer: Self-pay | Admitting: Hematology and Oncology

## 2022-10-04 VITALS — BP 158/86 | HR 86 | Temp 98.1°F | Resp 16 | Ht 65.0 in | Wt 348.6 lb

## 2022-10-04 DIAGNOSIS — Z803 Family history of malignant neoplasm of breast: Secondary | ICD-10-CM | POA: Diagnosis not present

## 2022-10-04 DIAGNOSIS — D0512 Intraductal carcinoma in situ of left breast: Secondary | ICD-10-CM | POA: Insufficient documentation

## 2022-10-04 DIAGNOSIS — Z90710 Acquired absence of both cervix and uterus: Secondary | ICD-10-CM | POA: Diagnosis not present

## 2022-10-04 DIAGNOSIS — I10 Essential (primary) hypertension: Secondary | ICD-10-CM | POA: Insufficient documentation

## 2022-10-04 DIAGNOSIS — Z800 Family history of malignant neoplasm of digestive organs: Secondary | ICD-10-CM | POA: Diagnosis not present

## 2022-10-04 NOTE — Assessment & Plan Note (Signed)
This is a very pleasant 56 year old postmenopausal female patient with newly diagnosed left breast grade 3 DCIS, ER weakly positive referred to medical oncology for second opinion.  She was seen by surgeon at [facility] and the recommendation was to consider lumpectomy followed by radiation and antiestrogen therapy.  Pathology review: I discussed with the patient the difference between DCIS and invasive breast cancer. It is considered a precancerous lesion. DCIS is classified as a Stage 0 breast cancer. It is generally detected through mammograms as calcifications. We discussed the significance of grades and its impact on prognosis. We also discussed the importance of ER and PR receptors and their implications to adjuvant treatment options. Prognosis of DCIS dependence on grade and degree of comedo necrosis. It is anticipated that if not treated, 20-30% of DCIS can develop into invasive breast cancer.  Recommendation: 1. Breast conserving surgery 2. Followed by adjuvant radiation therapy 3. Followed by antiestrogen therapy with tamoxifen/aromatase inhibitors based on menopausal status 5 years  Tamoxifen counseling: We discussed the risks and benefits of tamoxifen. These include but not limited to insomnia, hot flashes, mood changes, vaginal dryness, and weight gain. Although rare, serious side effects including endometrial cancer, risk of blood clots were also discussed. We strongly believe that the benefits far outweigh the risks. Patient understands these risks and consented to starting treatment. Planned treatment duration is 5 years.  Aromatase inhibitors counseling: We have discussed the mechanism of action of aromatase inhibitors today.  We have discussed adverse effects including but not limited to menopausal symptoms, increased risk of osteoporosis and fractures, cardiovascular events, arthralgias and myalgias.  We do believe that the benefits far outweigh the risks.  Plan treatment duration of 5  years.  There appears to be a focus suspicious for microinvasion.  If she indeed has invasive carcinoma, I recommended that she consider repeating the prognostics again.  If she has triple negative biology and tumor measuring more than 5 mm, she may be a candidate for adjuvant chemotherapy.  My card was given to her today to see if she would like to come back and follow-up with [place] versus stay in the [place] system.  She would like to think about it and let us know. Thank you for consulting [place] in the care of this patient.  Please do not hesitate to contact us with any additional questions or concerns.

## 2022-10-04 NOTE — Progress Notes (Signed)
[facility] CONSULT NOTE  Patient Care Team: Laurann Montana, MD as PCP - General (Family Medicine)  CHIEF COMPLAINTS/PURPOSE OF CONSULTATION:  Newly diagnosed breast cancer  HISTORY OF PRESENTING ILLNESS:   Jill Palmer 56 y.o. female is here because of recent diagnosis of left breast DCIS.   I reviewed her records extensively and collaborated the history with the patient.  SUMMARY OF ONCOLOGIC HISTORY: Oncology History  Ductal carcinoma in situ (DCIS) of left breast  08/24/2022 Mammogram   Bilateral screening mammogram showed calcifications in the deep upper outer left breast, additional imaging evaluation was recommended   09/15/2022 Pathology Results   Pathology showed at least DCIS, grade 3.  Focal features suspicious for microinvasion.  Prognostic showed ER low positive weak staining   10/04/2022 Initial Diagnosis   Ductal carcinoma in situ (DCIS) of left breast   10/04/2022 Cancer Staging   Staging form: Breast, AJCC 8th Edition - Pathologic: Stage 0 (pTis (DCIS), pN0, cM0, G3, ER+, PR+) - Signed by Rachel Moulds, MD on 10/04/2022 Histologic grading system: 3 grade system    Ms. Sophronia is here for an initial visit by herself.  She is most recently diagnosed with left breast DCIS, high-grade, ER weakly positive at [facility] and has seen a Careers adviser at [facility] but seeking second opinion and hence was seen here today.  She denies any abnormal mammograms prior to this.  She does not have any immediate family history of breast cancer.  She denies use of birth control or hormone replacement therapy.  She is nulliparous. She is hoping to have surgery done before the end of the year.  She has some other chronic comorbidities including hypertension, lymphedema, obesity. Rest of the pertinent 10 point ROS reviewed and negative  MEDICAL HISTORY:  Past Medical History:  Diagnosis Date   Anemia    Anxiety    Back pain    Bone spur    Bursitis of right foot    Chest  pain    Edema    GERD (gastroesophageal reflux disease)    Hernia, hiatal    Hypertension    Joint pain    Lymphedema of lower extremity    Osteoarthritis    Palpitations    Soft tissue infection    left leg   Thyroid disease    Venous stasis     SURGICAL HISTORY: Past Surgical History:  Procedure Laterality Date   ABDOMINAL HYSTERECTOMY     EYE SURGERY     TONSILLECTOMY      SOCIAL HISTORY: Social History   Socioeconomic History   Marital status: Single    Spouse name: Not on file   Number of children: Not on file   Years of education: Not on file   Highest education level: Not on file  Occupational History   Occupation: Interior and spatial designer  Tobacco Use   Smoking status: Never   Smokeless tobacco: Never  Vaping Use   Vaping Use: Never used  Substance and Sexual Activity   Alcohol use: No   Drug use: No   Sexual activity: Not Currently  Other Topics Concern   Not on file  Social History Narrative   Not on file   Social Determinants of Health   Financial Resource Strain: Not on file  Food Insecurity: Not on file  Transportation Needs: Not on file  Physical Activity: Not on file  Stress: Not on file  Social Connections: Not on file  Intimate Partner Violence: Not on file  FAMILY HISTORY: Family History  Problem Relation Age of Onset   Heart failure Mother    CAD Mother    CVA Mother    Hypertension Mother    Asthma Mother    COPD Mother    Depression Mother    Paranoid behavior Mother    Schizophrenia Mother    Anxiety disorder Mother    Obesity Mother    Heart failure Father    CAD Father    Bell's palsy Father    Sudden death Father    Hypertension Sister    Edema Sister    Hypertension Brother    Asthma Brother    Colon cancer Maternal Grandmother    CAD Paternal Grandfather    Other Brother        MVA   CAD Paternal Uncle    Bone cancer Paternal Uncle    Colon cancer Paternal Uncle    Breast cancer Other        aunt/82, 1st  cousin/40's, 2nd cousin/30   Rheum arthritis Other        2 aunts, 2 uncles   Colon polyps Other        1 uncle   Liver disease Neg Hx     ALLERGIES:  is allergic to septra [sulfamethoxazole-trimethoprim], latex, penicillins, tessalon perles [benzonatate], and meloxicam.  MEDICATIONS:  Current Outpatient Medications  Medication Sig Dispense Refill   meloxicam (MOBIC) 15 MG tablet Take 15 mg by mouth daily.     metoprolol succinate (TOPROL-XL) 50 MG 24 hr tablet Take 50 mg by mouth daily. Take with or immediately following a meal.     b complex vitamins tablet Take 1 tablet by mouth daily as needed.     calcium carbonate (OS-CAL) 600 MG TABS Take 600 mg by mouth every morning.      Multiple Vitamin (MULTIVITAMIN WITH MINERALS) TABS Take 1 tablet by mouth every morning.      triamterene-hydrochlorothiazide (MAXZIDE-25) 37.5-25 MG per tablet Take 1 tablet every morning by mouth. For fluid     vitamin C (ASCORBIC ACID) 500 MG tablet Take 500 mg by mouth daily as needed.     Vitamin D, Ergocalciferol, (DRISDOL) 1.25 MG (50000 UT) CAPS capsule Take 1 capsule (50,000 Units total) by mouth every 7 (seven) days. 4 capsule 0   No current facility-administered medications for this visit.    REVIEW OF SYSTEMS:   Constitutional: Denies fevers, chills or abnormal night sweats Eyes: Denies blurriness of vision, double vision or watery eyes Ears, nose, mouth, throat, and face: Denies mucositis or sore throat Respiratory: Denies cough, dyspnea or wheezes Cardiovascular: Denies palpitation, chest discomfort or lower extremity swelling Gastrointestinal:  Denies nausea, heartburn or change in bowel habits Skin: Denies abnormal skin rashes Lymphatics: Denies new lymphadenopathy or easy bruising Neurological:Denies numbness, tingling or new weaknesses Behavioral/Psych: Mood is stable, no new changes  Breast: Denies any palpable lumps or discharge All other systems were reviewed with the patient and  are negative.  PHYSICAL EXAMINATION: ECOG PERFORMANCE STATUS: 0 - Asymptomatic  Vitals:   10/04/22 0848  BP: (!) 158/86  Pulse: 86  Resp: 16  Temp: 98.1 F (36.7 C)  SpO2: 96%   Filed Weights   10/04/22 0848  Weight: (!) 348 lb 9.6 oz (158.1 kg)   Physical exam deferred today in lieu of counseling  LABORATORY DATA:  I have reviewed the data as listed Lab Results  Component Value Date   WBC 6.1 08/28/2018   HGB 14.0 08/28/2018  HCT 41.3 08/28/2018   MCV 82 08/28/2018   PLT 251 02/16/2013   Lab Results  Component Value Date   NA 141 08/28/2018   K 4.5 08/28/2018   CL 99 08/28/2018   CO2 27 08/28/2018    RADIOGRAPHIC STUDIES: I have personally reviewed the radiological reports and agreed with the findings in the report.  ASSESSMENT AND PLAN:  Ductal carcinoma in situ (DCIS) of left breast This is a very pleasant 56 year old postmenopausal female patient with newly diagnosed left breast grade 3 DCIS, ER weakly positive referred to medical oncology for second opinion.  She was seen by surgeon at [facility] and the recommendation was to consider lumpectomy followed by radiation and antiestrogen therapy.  Pathology review: I discussed with the patient the difference between DCIS and invasive breast cancer. It is considered a precancerous lesion. DCIS is classified as a Stage 0 breast cancer. It is generally detected through mammograms as calcifications. We discussed the significance of grades and its impact on prognosis. We also discussed the importance of ER and PR receptors and their implications to adjuvant treatment options. Prognosis of DCIS dependence on grade and degree of comedo necrosis. It is anticipated that if not treated, 20-30% of DCIS can develop into invasive breast cancer.  Recommendation: 1. Breast conserving surgery 2. Followed by adjuvant radiation therapy 3. Followed by antiestrogen therapy with tamoxifen/aromatase inhibitors based on menopausal status 5  years  Tamoxifen counseling: We discussed the risks and benefits of tamoxifen. These include but not limited to insomnia, hot flashes, mood changes, vaginal dryness, and weight gain. Although rare, serious side effects including endometrial cancer, risk of blood clots were also discussed. We strongly believe that the benefits far outweigh the risks. Patient understands these risks and consented to starting treatment. Planned treatment duration is 5 years.  Aromatase inhibitors counseling: We have discussed the mechanism of action of aromatase inhibitors today.  We have discussed adverse effects including but not limited to menopausal symptoms, increased risk of osteoporosis and fractures, cardiovascular events, arthralgias and myalgias.  We do believe that the benefits far outweigh the risks.  Plan treatment duration of 5 years.  There appears to be a focus suspicious for microinvasion.  If she indeed has invasive carcinoma, I recommended that she consider repeating the prognostics again.  If she has triple negative biology and tumor measuring more than 5 mm, she may be a candidate for adjuvant chemotherapy.  My card was given to her today to see if she would like to come back and follow-up with [place] versus stay in the [place] system.  She would like to think about it and let us know. Thank you for consulting [place] in the care of this patient.  Please do not hesitate to contact us with any additional questions or concerns.   All questions were answered. The patient knows to call the clinic with any problems, questions or concerns.    Rachel Moulds, MD 10/04/22

## 2022-10-05 ENCOUNTER — Telehealth: Payer: Self-pay | Admitting: *Deleted

## 2022-10-05 NOTE — Telephone Encounter (Signed)
Called pt, left vm with navigation resources and contact information for questions or needs.  Received msg from new pt coordinator at [facility] pt plans to keep treatment at [facility]. Msg sent to Delorise Jackson, nurse navigator at [facility] regarding msg from [facility].

## 2022-10-10 DIAGNOSIS — R920 Mammographic microcalcification found on diagnostic imaging of breast: Secondary | ICD-10-CM | POA: Diagnosis not present

## 2022-10-10 DIAGNOSIS — D0512 Intraductal carcinoma in situ of left breast: Secondary | ICD-10-CM | POA: Diagnosis not present

## 2022-10-24 DIAGNOSIS — K219 Gastro-esophageal reflux disease without esophagitis: Secondary | ICD-10-CM | POA: Diagnosis not present

## 2022-10-24 DIAGNOSIS — D0512 Intraductal carcinoma in situ of left breast: Secondary | ICD-10-CM | POA: Diagnosis not present

## 2022-10-24 DIAGNOSIS — Z882 Allergy status to sulfonamides status: Secondary | ICD-10-CM | POA: Diagnosis not present

## 2022-10-24 DIAGNOSIS — Z170 Estrogen receptor positive status [ER+]: Secondary | ICD-10-CM | POA: Diagnosis not present

## 2022-10-24 DIAGNOSIS — Z91040 Latex allergy status: Secondary | ICD-10-CM | POA: Diagnosis not present

## 2022-10-24 DIAGNOSIS — Z171 Estrogen receptor negative status [ER-]: Secondary | ICD-10-CM | POA: Diagnosis not present

## 2022-10-24 DIAGNOSIS — Z880 Allergy status to penicillin: Secondary | ICD-10-CM | POA: Diagnosis not present

## 2022-10-24 DIAGNOSIS — I10 Essential (primary) hypertension: Secondary | ICD-10-CM | POA: Diagnosis not present

## 2022-10-24 DIAGNOSIS — E669 Obesity, unspecified: Secondary | ICD-10-CM | POA: Diagnosis not present

## 2022-10-24 DIAGNOSIS — C50912 Malignant neoplasm of unspecified site of left female breast: Secondary | ICD-10-CM | POA: Diagnosis not present

## 2022-10-24 DIAGNOSIS — Z888 Allergy status to other drugs, medicaments and biological substances status: Secondary | ICD-10-CM | POA: Diagnosis not present

## 2022-10-24 DIAGNOSIS — Z6841 Body Mass Index (BMI) 40.0 and over, adult: Secondary | ICD-10-CM | POA: Diagnosis not present

## 2022-10-24 DIAGNOSIS — E039 Hypothyroidism, unspecified: Secondary | ICD-10-CM | POA: Diagnosis not present

## 2022-10-30 DIAGNOSIS — R7309 Other abnormal glucose: Secondary | ICD-10-CM | POA: Diagnosis not present

## 2022-11-01 DIAGNOSIS — Z171 Estrogen receptor negative status [ER-]: Secondary | ICD-10-CM | POA: Diagnosis not present

## 2022-11-01 DIAGNOSIS — C50512 Malignant neoplasm of lower-outer quadrant of left female breast: Secondary | ICD-10-CM | POA: Diagnosis not present

## 2022-11-01 DIAGNOSIS — Z9889 Other specified postprocedural states: Secondary | ICD-10-CM | POA: Diagnosis not present

## 2022-11-14 DIAGNOSIS — Z9889 Other specified postprocedural states: Secondary | ICD-10-CM | POA: Diagnosis not present

## 2022-11-14 DIAGNOSIS — Z6841 Body Mass Index (BMI) 40.0 and over, adult: Secondary | ICD-10-CM | POA: Diagnosis not present

## 2022-11-14 DIAGNOSIS — C50512 Malignant neoplasm of lower-outer quadrant of left female breast: Secondary | ICD-10-CM | POA: Diagnosis not present

## 2022-11-14 DIAGNOSIS — Z171 Estrogen receptor negative status [ER-]: Secondary | ICD-10-CM | POA: Diagnosis not present

## 2022-11-17 DIAGNOSIS — C50512 Malignant neoplasm of lower-outer quadrant of left female breast: Secondary | ICD-10-CM | POA: Diagnosis not present

## 2022-11-17 DIAGNOSIS — Z171 Estrogen receptor negative status [ER-]: Secondary | ICD-10-CM | POA: Diagnosis not present

## 2022-11-23 DIAGNOSIS — C50512 Malignant neoplasm of lower-outer quadrant of left female breast: Secondary | ICD-10-CM | POA: Diagnosis not present

## 2022-11-23 DIAGNOSIS — C50412 Malignant neoplasm of upper-outer quadrant of left female breast: Secondary | ICD-10-CM | POA: Diagnosis not present

## 2022-11-23 DIAGNOSIS — Z171 Estrogen receptor negative status [ER-]: Secondary | ICD-10-CM | POA: Diagnosis not present

## 2022-11-23 DIAGNOSIS — Z510 Encounter for antineoplastic radiation therapy: Secondary | ICD-10-CM | POA: Diagnosis not present

## 2022-11-27 DIAGNOSIS — M7671 Peroneal tendinitis, right leg: Secondary | ICD-10-CM | POA: Diagnosis not present

## 2022-11-27 DIAGNOSIS — M79671 Pain in right foot: Secondary | ICD-10-CM | POA: Diagnosis not present

## 2022-11-30 DIAGNOSIS — R7309 Other abnormal glucose: Secondary | ICD-10-CM | POA: Diagnosis not present

## 2022-12-05 DIAGNOSIS — Z510 Encounter for antineoplastic radiation therapy: Secondary | ICD-10-CM | POA: Diagnosis not present

## 2022-12-05 DIAGNOSIS — Z171 Estrogen receptor negative status [ER-]: Secondary | ICD-10-CM | POA: Diagnosis not present

## 2022-12-05 DIAGNOSIS — C50512 Malignant neoplasm of lower-outer quadrant of left female breast: Secondary | ICD-10-CM | POA: Diagnosis not present

## 2022-12-05 DIAGNOSIS — C50412 Malignant neoplasm of upper-outer quadrant of left female breast: Secondary | ICD-10-CM | POA: Diagnosis not present

## 2022-12-06 DIAGNOSIS — C50512 Malignant neoplasm of lower-outer quadrant of left female breast: Secondary | ICD-10-CM | POA: Diagnosis not present

## 2022-12-06 DIAGNOSIS — Z171 Estrogen receptor negative status [ER-]: Secondary | ICD-10-CM | POA: Diagnosis not present

## 2022-12-11 DIAGNOSIS — C50512 Malignant neoplasm of lower-outer quadrant of left female breast: Secondary | ICD-10-CM | POA: Diagnosis not present

## 2022-12-11 DIAGNOSIS — Z171 Estrogen receptor negative status [ER-]: Secondary | ICD-10-CM | POA: Diagnosis not present

## 2022-12-11 DIAGNOSIS — Z510 Encounter for antineoplastic radiation therapy: Secondary | ICD-10-CM | POA: Diagnosis not present

## 2022-12-11 DIAGNOSIS — C50412 Malignant neoplasm of upper-outer quadrant of left female breast: Secondary | ICD-10-CM | POA: Diagnosis not present

## 2022-12-13 DIAGNOSIS — Z510 Encounter for antineoplastic radiation therapy: Secondary | ICD-10-CM | POA: Diagnosis not present

## 2022-12-13 DIAGNOSIS — Z171 Estrogen receptor negative status [ER-]: Secondary | ICD-10-CM | POA: Diagnosis not present

## 2022-12-13 DIAGNOSIS — C50412 Malignant neoplasm of upper-outer quadrant of left female breast: Secondary | ICD-10-CM | POA: Diagnosis not present

## 2022-12-13 DIAGNOSIS — C50512 Malignant neoplasm of lower-outer quadrant of left female breast: Secondary | ICD-10-CM | POA: Diagnosis not present

## 2022-12-15 DIAGNOSIS — C50412 Malignant neoplasm of upper-outer quadrant of left female breast: Secondary | ICD-10-CM | POA: Diagnosis not present

## 2022-12-15 DIAGNOSIS — Z171 Estrogen receptor negative status [ER-]: Secondary | ICD-10-CM | POA: Diagnosis not present

## 2022-12-15 DIAGNOSIS — Z510 Encounter for antineoplastic radiation therapy: Secondary | ICD-10-CM | POA: Diagnosis not present

## 2022-12-15 DIAGNOSIS — C50512 Malignant neoplasm of lower-outer quadrant of left female breast: Secondary | ICD-10-CM | POA: Diagnosis not present

## 2022-12-19 DIAGNOSIS — Z171 Estrogen receptor negative status [ER-]: Secondary | ICD-10-CM | POA: Diagnosis not present

## 2022-12-19 DIAGNOSIS — C50412 Malignant neoplasm of upper-outer quadrant of left female breast: Secondary | ICD-10-CM | POA: Diagnosis not present

## 2022-12-19 DIAGNOSIS — C50512 Malignant neoplasm of lower-outer quadrant of left female breast: Secondary | ICD-10-CM | POA: Diagnosis not present

## 2022-12-19 DIAGNOSIS — Z510 Encounter for antineoplastic radiation therapy: Secondary | ICD-10-CM | POA: Diagnosis not present

## 2022-12-21 DIAGNOSIS — Z171 Estrogen receptor negative status [ER-]: Secondary | ICD-10-CM | POA: Diagnosis not present

## 2022-12-21 DIAGNOSIS — C50512 Malignant neoplasm of lower-outer quadrant of left female breast: Secondary | ICD-10-CM | POA: Diagnosis not present

## 2022-12-21 DIAGNOSIS — Z510 Encounter for antineoplastic radiation therapy: Secondary | ICD-10-CM | POA: Diagnosis not present

## 2022-12-21 DIAGNOSIS — C50412 Malignant neoplasm of upper-outer quadrant of left female breast: Secondary | ICD-10-CM | POA: Diagnosis not present

## 2022-12-29 DIAGNOSIS — R7309 Other abnormal glucose: Secondary | ICD-10-CM | POA: Diagnosis not present

## 2023-01-29 DIAGNOSIS — D485 Neoplasm of uncertain behavior of skin: Secondary | ICD-10-CM | POA: Diagnosis not present

## 2023-01-29 DIAGNOSIS — R7309 Other abnormal glucose: Secondary | ICD-10-CM | POA: Diagnosis not present

## 2023-01-29 DIAGNOSIS — B079 Viral wart, unspecified: Secondary | ICD-10-CM | POA: Diagnosis not present

## 2023-02-07 DIAGNOSIS — M5442 Lumbago with sciatica, left side: Secondary | ICD-10-CM | POA: Diagnosis not present

## 2023-02-07 DIAGNOSIS — M5441 Lumbago with sciatica, right side: Secondary | ICD-10-CM | POA: Diagnosis not present

## 2023-02-09 ENCOUNTER — Other Ambulatory Visit: Payer: Self-pay | Admitting: Family Medicine

## 2023-02-09 DIAGNOSIS — I10 Essential (primary) hypertension: Secondary | ICD-10-CM | POA: Diagnosis not present

## 2023-02-09 DIAGNOSIS — M1612 Unilateral primary osteoarthritis, left hip: Secondary | ICD-10-CM | POA: Diagnosis not present

## 2023-02-09 DIAGNOSIS — M5442 Lumbago with sciatica, left side: Secondary | ICD-10-CM

## 2023-02-09 DIAGNOSIS — C50912 Malignant neoplasm of unspecified site of left female breast: Secondary | ICD-10-CM | POA: Diagnosis not present

## 2023-02-13 DIAGNOSIS — Z171 Estrogen receptor negative status [ER-]: Secondary | ICD-10-CM | POA: Diagnosis not present

## 2023-02-13 DIAGNOSIS — Z6841 Body Mass Index (BMI) 40.0 and over, adult: Secondary | ICD-10-CM | POA: Diagnosis not present

## 2023-02-13 DIAGNOSIS — M549 Dorsalgia, unspecified: Secondary | ICD-10-CM | POA: Diagnosis not present

## 2023-02-13 DIAGNOSIS — C50512 Malignant neoplasm of lower-outer quadrant of left female breast: Secondary | ICD-10-CM | POA: Diagnosis not present

## 2023-02-15 ENCOUNTER — Encounter: Payer: Self-pay | Admitting: Family Medicine

## 2023-02-16 DIAGNOSIS — Z713 Dietary counseling and surveillance: Secondary | ICD-10-CM | POA: Diagnosis not present

## 2023-02-19 DIAGNOSIS — I10 Essential (primary) hypertension: Secondary | ICD-10-CM | POA: Diagnosis not present

## 2023-02-19 DIAGNOSIS — Z136 Encounter for screening for cardiovascular disorders: Secondary | ICD-10-CM | POA: Diagnosis not present

## 2023-02-19 DIAGNOSIS — Z6841 Body Mass Index (BMI) 40.0 and over, adult: Secondary | ICD-10-CM | POA: Diagnosis not present

## 2023-02-22 DIAGNOSIS — I700 Atherosclerosis of aorta: Secondary | ICD-10-CM | POA: Diagnosis not present

## 2023-02-22 DIAGNOSIS — F419 Anxiety disorder, unspecified: Secondary | ICD-10-CM | POA: Diagnosis not present

## 2023-02-22 DIAGNOSIS — I10 Essential (primary) hypertension: Secondary | ICD-10-CM | POA: Diagnosis not present

## 2023-02-22 DIAGNOSIS — E785 Hyperlipidemia, unspecified: Secondary | ICD-10-CM | POA: Diagnosis not present

## 2023-02-23 ENCOUNTER — Ambulatory Visit
Admission: RE | Admit: 2023-02-23 | Discharge: 2023-02-23 | Disposition: A | Discharge status: Home / Self Care | Payer: BC Managed Care – PPO | Source: Ambulatory Visit | Attending: Family Medicine | Admitting: Family Medicine

## 2023-02-23 ENCOUNTER — Other Ambulatory Visit: Payer: Self-pay | Admitting: Family Medicine

## 2023-02-23 DIAGNOSIS — R0609 Other forms of dyspnea: Secondary | ICD-10-CM

## 2023-02-23 DIAGNOSIS — R0602 Shortness of breath: Secondary | ICD-10-CM | POA: Diagnosis not present

## 2023-02-28 DIAGNOSIS — R7309 Other abnormal glucose: Secondary | ICD-10-CM | POA: Diagnosis not present

## 2023-03-08 ENCOUNTER — Ambulatory Visit
Admission: RE | Admit: 2023-03-08 | Discharge: 2023-03-08 | Disposition: A | Discharge status: Home / Self Care | Payer: BC Managed Care – PPO | Source: Ambulatory Visit | Attending: Family Medicine | Admitting: Family Medicine

## 2023-03-08 DIAGNOSIS — M5442 Lumbago with sciatica, left side: Secondary | ICD-10-CM

## 2023-03-08 DIAGNOSIS — M546 Pain in thoracic spine: Secondary | ICD-10-CM | POA: Diagnosis not present

## 2023-03-08 DIAGNOSIS — Z853 Personal history of malignant neoplasm of breast: Secondary | ICD-10-CM | POA: Diagnosis not present

## 2023-03-08 DIAGNOSIS — M545 Low back pain, unspecified: Secondary | ICD-10-CM | POA: Diagnosis not present

## 2023-03-08 MED ORDER — GADOPICLENOL 0.5 MMOL/ML IV SOLN
10.0000 mL | Freq: Once | INTRAVENOUS | Status: AC | PRN
  Administered 2023-03-08: 10 mL via INTRAVENOUS

## 2023-03-13 ENCOUNTER — Encounter: Payer: Self-pay | Admitting: Cardiology

## 2023-03-13 ENCOUNTER — Ambulatory Visit: Payer: BC Managed Care – PPO | Admitting: Cardiology

## 2023-03-13 VITALS — BP 154/89 | HR 88 | Resp 19 | Ht 65.0 in | Wt 356.8 lb

## 2023-03-13 DIAGNOSIS — I10 Essential (primary) hypertension: Secondary | ICD-10-CM

## 2023-03-13 DIAGNOSIS — Z01810 Encounter for preprocedural cardiovascular examination: Secondary | ICD-10-CM

## 2023-03-13 DIAGNOSIS — E782 Mixed hyperlipidemia: Secondary | ICD-10-CM

## 2023-03-13 DIAGNOSIS — R0602 Shortness of breath: Secondary | ICD-10-CM | POA: Diagnosis not present

## 2023-03-13 DIAGNOSIS — D0512 Intraductal carcinoma in situ of left breast: Secondary | ICD-10-CM

## 2023-03-13 DIAGNOSIS — Z789 Other specified health status: Secondary | ICD-10-CM

## 2023-03-13 DIAGNOSIS — I700 Atherosclerosis of aorta: Secondary | ICD-10-CM

## 2023-03-13 NOTE — Progress Notes (Signed)
ID:  GLADYES PERDOMO, DOB May 19, 1966, MRN [id number]  PCP:  Laurann Montana, MD  Cardiologist:  Tessa Lerner, DO, [facility] (established care 03/13/2023) Former Cardiology Providers: Dr. Elease Hashimoto  REASON FOR CONSULT: Dyspnea on exertion, preop clearance for bariatric surgery rule out CAD  REQUESTING PHYSICIAN:  Laurann Montana, MD [phone] [place],  [place] [address]  Chief Complaint  Patient presents with   Shortness of Breath   New Patient (Initial Visit)    HPI  Jill Palmer is a 57 y.o. Caucasian female who presents to the clinic for evaluation of dyspnea on exertion/preop clearance at the request of Laurann Montana, MD. 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  Patient states that his shortness of breath has been ongoing for years however since January 2024 has been more prominent.  Symptoms are predominately with effort related activities.  Improves with resting.  Other confounding factors may include radiation therapy for her breast cancer in the recent past.  Patient sleeps in a recliner due to comfort.  When she lays flat she usually has coughing spells given her history of bronchitis.  Therefore unable to comment on orthopnea or PND.  She is under the care of of Dr. Adolphus Birchwood at [facility] health weight loss clinic.  She is awaiting a prescription for Mounjaro.  Patient was told that she needs to lose 40 to 50 pounds prior to being considered for weight loss surgery.   FUNCTIONAL STATUS: She goes to the gym 3 to 4 days a week, does a stationary bike for about 4 miles.  Some resistance training with free  weights.  ALLERGIES: Allergies  Allergen Reactions   Septra [Sulfamethoxazole-Trimethoprim] Rash   Latex Itching   Penicillins Itching   Tessalon Perles [Benzonatate] Other (See Comments)    Muscle weakness    Meloxicam Rash    MEDICATION LIST PRIOR TO VISIT: Current Meds  Medication Sig   b complex vitamins tablet Take 1 tablet by mouth daily as needed.   meloxicam (MOBIC) 15 MG tablet Take 15 mg by mouth daily.   metoprolol succinate (TOPROL-XL) 50 MG 24 hr tablet Take 50 mg by mouth daily. Take with or immediately following a meal.   Multiple Vitamin (MULTIVITAMIN WITH MINERALS) TABS Take 1 tablet by mouth every morning.    sertraline (ZOLOFT) 25 MG tablet Take 50 mg by mouth daily.   triamterene-hydrochlorothiazide (MAXZIDE-25) 37.5-25 MG per tablet Take 1 tablet every morning by mouth. For fluid   Vitamin D, Ergocalciferol, (DRISDOL) 1.25 MG (50000 UT) CAPS capsule Take 1 capsule (50,000 Units total) by mouth every 7 (seven) days.     PAST MEDICAL HISTORY: Past Medical History:  Diagnosis Date   Anemia    Anxiety    Back pain    Bone spur    Bursitis of right foot    Chest pain    Edema    GERD (gastroesophageal reflux disease)    Hernia, hiatal    Hypertension    Joint pain    Lymphedema of lower extremity    Osteoarthritis    Palpitations    Soft tissue infection    left leg   Thyroid disease    Venous stasis  PAST SURGICAL HISTORY: Past Surgical History:  Procedure Laterality Date   ABDOMINAL HYSTERECTOMY     EYE SURGERY     TONSILLECTOMY    Strabismus surgery. Myomectomy x 2. Hysterectomy without oophorectomy. Lumpectomy  FAMILY HISTORY: The patient family history includes Anxiety disorder in her mother; Asthma in her brother and mother; Bell's palsy in her father; Bone cancer in her paternal uncle; Breast cancer in an other family member; CAD in her father, mother, paternal grandfather, and paternal uncle; COPD in her mother; CVA in her  mother; Colon cancer in her maternal grandmother and paternal uncle; Colon polyps in an other family member; Depression in her mother; Edema in her sister; Heart failure in her father and mother; Hypertension in her brother, mother, and sister; Obesity in her mother; Other in her brother; Paranoid behavior in her mother; Rheum arthritis in an other family member; Schizophrenia in her mother; Sudden death in her father.  SOCIAL HISTORY:  The patient  reports that she has never smoked. She has never used smokeless tobacco. She reports that she does not drink alcohol and does not use drugs.  REVIEW OF SYSTEMS: Review of Systems  Cardiovascular:  Negative for chest pain, claudication, dyspnea on exertion, irregular heartbeat, near-syncope, palpitations and syncope. Leg swelling: Lymphedema. Respiratory:  Positive for shortness of breath.   Hematologic/Lymphatic: Negative for bleeding problem.  Musculoskeletal:  Negative for muscle cramps and myalgias.  Neurological:  Negative for dizziness and light-headedness.    PHYSICAL EXAM:    03/13/2023   12:46 PM 10/04/2022    8:48 AM 06/27/2022    7:53 PM  Vitals with BMI  Height 5\' 5"  5\' 5"    Weight 356 lbs 13 oz 348 lbs 10 oz   BMI 59.37 58.01   Systolic 154 158 161  Diastolic 89 86 88  Pulse 88 86 77    Physical Exam  Constitutional: No distress.  Appears older than stated age, hemodynamically stable  Neck:  Unable to evaluate JVP due to adipose tissue and short neck stature  Cardiovascular: Normal rate, regular rhythm, S1 normal, S2 normal, intact distal pulses and normal pulses. Exam reveals no gallop, no S3 and no S4.  Murmur heard. Systolic murmur is present with a grade of 3/6 at the upper right sternal border. Unable to evaluate distal pulses due to adipose tissue/lymphedema  Pulmonary/Chest: Effort normal and breath sounds normal. No stridor. She has no wheezes. She has no rales.  Abdominal: Soft. Bowel sounds are normal. She  exhibits no distension. There is no abdominal tenderness.  Abdominal obesity  Musculoskeletal:        General: Edema (Lymphedema bilaterally) present.     Cervical back: Neck supple.  Neurological: She is alert and oriented to person, place, and time. She has intact cranial nerves (2-12).  Skin: Skin is warm and moist.   CARDIAC DATABASE: EKG: Mar 13, 2023: Sinus rhythm, 73 bpm, normal axis, LAE, without underlying ischemia or injury pattern.  Echocardiogram: 09/06/2018 - Left ventricle: The cavity size was normal. Systolic function was    normal. The estimated ejection fraction was in the range of 60%    to 65%. Left ventricular diastolic function parameters were    normal.  - Aortic valve: Transvalvular velocity was within the normal range.    There was no stenosis. There was no regurgitation.  - Mitral valve: Transvalvular velocity was within the normal range.    There was no evidence for stenosis. There was trivial    regurgitation.  -  Tricuspid valve: There was trivial regurgitation.    Stress Testing: No results found for this or any previous visit from the past 1095 days.   Heart Catheterization: None  LABORATORY DATA: External Labs: Collected: 02/22/2023 BUN 16, creatinine 0.85. eGFR 79. Sodium 140, potassium 4.3, chloride 103, bicarb 30. Hemoglobin 14.1, hematocrit 43.5  Collected: 08/23/2022 provided by PCP Total cholesterol 197, triglycerides 139, HDL 55, LDL calculated 118, non-HDL 142   IMPRESSION:    ICD-10-CM   1. Shortness of breath on exertion  R06.02 EKG 12-Lead    ECHOCARDIOGRAM COMPLETE    2. Preop cardiovascular exam  Z01.810 CT CARDIAC SCORING (SELF PAY ONLY)    3. Benign hypertension  I10     4. Mixed hyperlipidemia  E78.2     5. Statin intolerance  Z78.9     6. Atherosclerosis of aorta (HCC)  I70.0     7. Ductal carcinoma in situ (DCIS) of left breast  D05.12     8. Class 3 severe obesity due to excess calories with serious  comorbidity and body mass index (BMI) of 50.0 to 59.9 in adult [facility])  E66.01    Z68.43        RECOMMENDATIONS: ARLECIA MARSCHNER is a 57 y.o. 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  Shortness of breath on exertion Multifactorial From cardiovascular standpoint we will proceed with echocardiogram to evaluate for structural heart disease and coronary calcium score to further risk stratify her from CAD perspective. She has an upcoming appointment with pulmonary medicine and she would benefit from PFTs and even sleep study. Clinically is euvolemic. Other confounding factors include morbid obesity, deconditioning, clinical OHS, etc.  Preop cardiovascular exam Plans to have weight loss surgery for once she has lost 40 to 50 pounds.  Sees Dr. Adolphus Birchwood at [facility].  EKG is nonischemic Her symptoms are not suggestive of ischemic equivalents.  Recommend increasing physical activity as tolerated.  Whether she needs ischemic workup will be determined closer to her date of surgery.  Testing as discussed above  Benign hypertension Office blood pressures are not well-controlled. Home blood pressures are closer to 130/80 per patient. I do not have a log of blood pressures to review. Currently managed by primary care provider.  Mixed hyperlipidemia Statin intolerance Atherosclerosis of aorta (HCC) Not on aspirin or statin therapy. Rosuvastatin (muscle weakness), atorvastatin (hair loss.), pravastatin (edema) Will discuss nonstatin options based on her CAC score.   Class 3 severe obesity due to excess calories with serious comorbidity and body mass index (BMI) of 50.0 to 59.9 in adult [facility]) Body mass index is 59.37 kg/m. I reviewed with her importance of  diet, regular physical activity/exercise, weight loss.   Patient is educated on the importance of increasing physical activity gradually as tolerated with a goal of moderate intensity exercise for 30 minutes a day 5 days a week.  Data Reviewed: I have independently reviewed external notes provided by the referring provider as part of this office visit.   I have independently reviewed results of EKG, labs as part of medical decision making. I have ordered the following tests:  Orders Placed This Encounter  Procedures   CT CARDIAC SCORING (SELF PAY ONLY)    Standing Status:   Future    Standing Expiration Date:   03/12/2024    Order Specific Question:  Preferred imaging location?    Answer:   External    Order Specific Question:   Is patient pregnant?    Answer:   No   EKG 12-Lead   ECHOCARDIOGRAM COMPLETE    Standing Status:   Future    Standing Expiration Date:   03/12/2024    Order Specific Question:   Where should this test be performed    Answer:   [facility]    Order Specific Question:   Perflutren DEFINITY (image enhancing agent) should be administered unless hypersensitivity or allergy exist    Answer:   Administer Perflutren    Order Specific Question:   Reason for exam-Echo    Answer:   Preoperative evaluation    Order Specific Question:   Reason for exam-Echo    Answer:   Dyspnea  R06.00   I have made medications changes at today's encounter as noted above.  FINAL MEDICATION LIST END OF ENCOUNTER: No orders of the defined types were placed in this encounter.   Medications Discontinued During This Encounter  Medication Reason   calcium carbonate (OS-CAL) 600 MG TABS    vitamin C (ASCORBIC ACID) 500 MG tablet      Current Outpatient Medications:    b complex vitamins tablet, Take 1 tablet by mouth daily as needed., Disp: , Rfl:    meloxicam (MOBIC) 15 MG tablet, Take 15 mg by mouth daily., Disp: , Rfl:    metoprolol succinate (TOPROL-XL) 50 MG 24 hr tablet, Take 50 mg  by mouth daily. Take with or immediately following a meal., Disp: , Rfl:    Multiple Vitamin (MULTIVITAMIN WITH MINERALS) TABS, Take 1 tablet by mouth every morning. , Disp: , Rfl:    sertraline (ZOLOFT) 25 MG tablet, Take 50 mg by mouth daily., Disp: , Rfl:    triamterene-hydrochlorothiazide (MAXZIDE-25) 37.5-25 MG per tablet, Take 1 tablet every morning by mouth. For fluid, Disp: , Rfl:    Vitamin D, Ergocalciferol, (DRISDOL) 1.25 MG (50000 UT) CAPS capsule, Take 1 capsule (50,000 Units total) by mouth every 7 (seven) days., Disp: 4 capsule, Rfl: 0  Orders Placed This Encounter  Procedures   CT CARDIAC SCORING (SELF PAY ONLY)   EKG 12-Lead   ECHOCARDIOGRAM COMPLETE    There are no Patient Instructions on file for this visit.   --Continue cardiac medications as reconciled in final medication list. --Return in about 6 weeks (around 04/24/2023) for Follow up, Dyspnea, Review test results. or sooner if needed. --Continue follow-up with your primary care physician regarding the management of your other chronic comorbid conditions.  Patient's questions and concerns were addressed to her satisfaction. She voices understanding of the instructions provided during this encounter.   This note was created using a voice recognition software as a result there may be grammatical errors inadvertently enclosed that do not reflect the nature of this encounter. Every attempt is made to correct such errors.  Tessa Lerner, [place], [facility]  Pager:  [phone] Office: [phone]

## 2023-03-23 ENCOUNTER — Ambulatory Visit (INDEPENDENT_AMBULATORY_CARE_PROVIDER_SITE_OTHER): Payer: BC Managed Care – PPO | Admitting: Pulmonary Disease

## 2023-03-23 ENCOUNTER — Encounter: Payer: Self-pay | Admitting: Pulmonary Disease

## 2023-03-23 VITALS — BP 118/76 | HR 71 | Temp 98.2°F | Ht 65.0 in | Wt 349.0 lb

## 2023-03-23 DIAGNOSIS — Z713 Dietary counseling and surveillance: Secondary | ICD-10-CM | POA: Diagnosis not present

## 2023-03-23 DIAGNOSIS — R0602 Shortness of breath: Secondary | ICD-10-CM | POA: Diagnosis not present

## 2023-03-23 MED ORDER — FLUTICASONE-SALMETEROL 115-21 MCG/ACT IN AERO: 2.0000 | INHALATION_SPRAY | Freq: Two times a day (BID) | RESPIRATORY_TRACT | 12 refills | Status: AC

## 2023-03-23 NOTE — Patient Instructions (Addendum)
Start advair HFA 115-76mcg 2 puffs twice daily  - rinse mouth out after each use  Follow up in 2 months with pulmonary function tests

## 2023-03-23 NOTE — Progress Notes (Unsigned)
Synopsis: Referred in May 2024 for shortness of breath by Laurann Montana, MD  Subjective:   PATIENT ID: Jill Palmer GENDER: female DOB: 1966-02-06, MRN: [id number]  HPI  Chief Complaint  Patient presents with   Consult    DOE CXR 02/23/23   Jill Palmer is a 57 year old woman, never smoker with GERD and hypertension who is referred for shortness of breath.   Patient reports dyspnea on exertion over the past year.  The dyspnea is noted with walking or when she is exerting herself to feed her cat.  She is currently working out using a recumbent bike and can ride 4 to 7 miles continuously which can be up to 33 minutes in time.  She is also doing 3 days of strength training per week.  She is currently working on weight loss through the [employer] management clinic.  She is not able to qualify for weight loss drug based on her insurance coverage.  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 She had radiation therapy 5 concentrated every other day treatments for 2 weeks. Completed in February 2024.  She gained about 20 pounds since her diagnosis.  She notices increased cough and heart palpitation symptoms during stressful times.  She notices intermittent wheezing when laying down for bed at night.  Work is currently stressful at the moment.  She denies significant snoring history but has awakened herself due to snoring episodes at times.  She normally feels rested in the morning time and denies any excessive afternoon sleepiness.  She has recently undergone cardiac evaluation for the shortness of breath.  She understands she is overweight and this can contribute to her shortness of breath that she wishes to make sure nothing else is going on with her lungs.  She was evaluated by cardiology 03/13/2023 with plans for echocardiogram and CT cardiac scoring scan.  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  No history of occupational dust or chemical exposures.  She is a never smoker.  Past Medical History:  Diagnosis Date   Anemia    Anxiety    Back pain    Bone spur    Breast cancer (HCC)    Bursitis of right foot    Chest pain    Edema    GERD (gastroesophageal reflux disease)    Hernia, hiatal    Hypertension    Joint pain    Lipedema    Lymphedema of lower extremity    Osteoarthritis    Palpitations    Soft tissue infection    left leg   Thyroid disease    Venous stasis      Family History  Problem Relation Age of Onset   Heart failure Mother    CAD Mother    CVA Mother    Hypertension Mother    Asthma Mother    COPD Mother    Depression Mother    Paranoid behavior Mother    Schizophrenia Mother    Anxiety disorder Mother    Obesity Mother    Heart failure Father    CAD Father    Bell's palsy Father    Sudden death Father    Hypertension Sister    Edema Sister  Hypertension Brother    Asthma Brother    Colon cancer Maternal Grandmother    CAD Paternal Grandfather    Other Brother        MVA   CAD Paternal Uncle    Bone cancer Paternal Uncle    Colon cancer Paternal Uncle    Breast cancer Other        aunt/82, 1st cousin/40's, 2nd cousin/30   Rheum arthritis Other        2 aunts, 2 uncles   Colon polyps Other        1 uncle   Liver disease Neg Hx      Social History   Socioeconomic History   Marital status: Single    Spouse name: Not on file   Number of children: Not on file   Years of education: Not on file   Highest education level: Not on file  Occupational History   Occupation: Interior and spatial designer  Tobacco Use   Smoking status: Never   Smokeless tobacco: Never  Vaping Use   Vaping Use: Never used  Substance and Sexual Activity   Alcohol use: No   Drug  use: No   Sexual activity: Not Currently    Birth control/protection: Surgical  Other Topics Concern   Not on file  Social History Narrative   Not on file   Social Determinants of Health   Financial Resource Strain: Not on file  Food Insecurity: Not on file  Transportation Needs: Not on file  Physical Activity: Not on file  Stress: Not on file  Social Connections: Not on file  Intimate Partner Violence: Not on file     Allergies  Allergen Reactions   Septra [Sulfamethoxazole-Trimethoprim] Rash   Latex Itching   Penicillins Itching   Tessalon Perles [Benzonatate] Other (See Comments)    Muscle weakness    Meloxicam Rash     Outpatient Medications Prior to Visit  Medication Sig Dispense Refill   metoprolol succinate (TOPROL-XL) 50 MG 24 hr tablet Take 50 mg by mouth daily. Take with or immediately following a meal.     Multiple Vitamin (MULTIVITAMIN WITH MINERALS) TABS Take 1 tablet by mouth every morning.      sertraline (ZOLOFT) 25 MG tablet Take 50 mg by mouth daily.     triamterene-hydrochlorothiazide (MAXZIDE-25) 37.5-25 MG per tablet Take 1 tablet every morning by mouth. For fluid     Vitamin D, Ergocalciferol, (DRISDOL) 1.25 MG (50000 UT) CAPS capsule Take 1 capsule (50,000 Units total) by mouth every 7 (seven) days. 4 capsule 0   meloxicam (MOBIC) 15 MG tablet Take 15 mg by mouth daily. (Patient not taking: Reported on 03/23/2023)     b complex vitamins tablet Take 1 tablet by mouth daily as needed.     No facility-administered medications prior to visit.    Review of Systems  Constitutional:  Negative for chills, fever, malaise/fatigue and weight loss.  HENT:  Negative for congestion, sinus pain and sore throat.   Eyes: Negative.   Respiratory:  Positive for shortness of breath. Negative for cough, hemoptysis, sputum production and wheezing.   Cardiovascular:  Positive for leg swelling (lipedema of legs). Negative for chest pain, palpitations, orthopnea and  claudication.  Gastrointestinal:  Negative for abdominal pain, heartburn, nausea and vomiting.  Genitourinary: Negative.   Musculoskeletal:  Negative for joint pain and myalgias.  Skin:  Negative for rash.  Neurological:  Negative for weakness.  Endo/Heme/Allergies: Negative.   Psychiatric/Behavioral: Negative.     Objective:  Vitals:   03/23/23 1542  BP: 118/76  Pulse: 71  Temp: 98.2 F (36.8 C)  TempSrc: Oral  SpO2: 97%  Weight: (!) 349 lb (158.3 kg)  Height: 5\' 5"  (1.651 m)   Physical Exam Constitutional:      General: She is not in acute distress.    Appearance: She is obese. She is not ill-appearing.  HENT:     Head: Normocephalic and atraumatic.  Eyes:     General: No scleral icterus.    Conjunctiva/sclera: Conjunctivae normal.  Cardiovascular:     Rate and Rhythm: Normal rate and regular rhythm.     Pulses: Normal pulses.     Heart sounds: Normal heart sounds. No murmur heard. Pulmonary:     Effort: Pulmonary effort is normal.     Breath sounds: Normal breath sounds. No wheezing, rhonchi or rales.  Abdominal:     General: Bowel sounds are normal.     Palpations: Abdomen is soft.  Musculoskeletal:        General: Swelling (lipedema bilaterally) present.  Lymphadenopathy:     Cervical: No cervical adenopathy.  Skin:    General: Skin is warm and dry.  Neurological:     General: No focal deficit present.     Mental Status: She is alert.    CBC    Component Value Date/Time   WBC 6.1 08/28/2018 0919   WBC 10.1 02/16/2013 0004   RBC 5.05 08/28/2018 0919   RBC 5.35 (H) 02/16/2013 0004   HGB 14.0 08/28/2018 0919   HCT 41.3 08/28/2018 0919   PLT 251 02/16/2013 0004   MCV 82 08/28/2018 0919   MCH 27.7 08/28/2018 0919   MCH 27.3 02/16/2013 0004   MCHC 33.9 08/28/2018 0919   MCHC 33.4 02/16/2013 0004   RDW 13.9 08/28/2018 0919   LYMPHSABS 1.2 08/28/2018 0919   MONOABS 0.8 02/16/2013 0004   EOSABS 0.2 08/28/2018 0919   BASOSABS 0.1 08/28/2018 0919       Latest Ref Rng & Units 08/28/2018    9:19 AM 02/16/2013   12:04 AM 09/29/2012    8:10 PM  BMP  Glucose 65 - 99 mg/dL 85  96  76   BUN 6 - 24 mg/dL 19  16  14    Creatinine 0.57 - 1.00 mg/dL 4.54  0.98  1.19   BUN/Creat Ratio 9 - 23 25     Sodium 134 - 144 mmol/L 141  136  138   Potassium 3.5 - 5.2 mmol/L 4.5  3.8  3.6   Chloride 96 - 106 mmol/L 99  101  102   CO2 20 - 29 mmol/L 27  25  28    Calcium 8.7 - 10.2 mg/dL 9.3  9.6  9.0    Chest imaging: CXR 02/23/23 The heart size and mediastinal contours are within normal limits. Both lungs are clear. The visualized skeletal structures are unremarkable.  PFT:     No data to display          Labs:  Path:  Echo: 2019: EF 60%. Not able to assess RV.   Heart Catheterization:  Assessment & Plan:   Shortness of breath on exertion - Plan: Pulmonary Function Test, fluticasone-salmeterol (ADVAIR HFA) 115-21 MCG/ACT inhaler  Discussion: Jill Palmer is a 58 year old woman, never smoker with GERD and hypertension who is referred for shortness of breath.   Differential for patient's shortness of breath includes obesity with deconditioning vs reactive airways disease vs cardiovascular disease.  She is to  try Advair HFA 115-21 mcg 2 puffs twice daily and monitor for improvement in her dyspnea and evening time wheezing.  I have encouraged her to continue her physical activity regimen and weight loss journey.  We will follow-up the cardiology studies and check pulmonary function tests at follow-up in 2 months.  Melody Comas, MD [facility] Office: [phone]   Current Outpatient Medications:    fluticasone-salmeterol (ADVAIR HFA) 115-21 MCG/ACT inhaler, Inhale 2 puffs into the lungs 2 (two) times daily., Disp: 1 each, Rfl: 12   metoprolol succinate (TOPROL-XL) 50 MG 24 hr tablet, Take 50 mg by mouth daily. Take with or immediately following a meal., Disp: , Rfl:    Multiple Vitamin (MULTIVITAMIN  WITH MINERALS) TABS, Take 1 tablet by mouth every morning. , Disp: , Rfl:    sertraline (ZOLOFT) 25 MG tablet, Take 50 mg by mouth daily., Disp: , Rfl:    triamterene-hydrochlorothiazide (MAXZIDE-25) 37.5-25 MG per tablet, Take 1 tablet every morning by mouth. For fluid, Disp: , Rfl:    Vitamin D, Ergocalciferol, (DRISDOL) 1.25 MG (50000 UT) CAPS capsule, Take 1 capsule (50,000 Units total) by mouth every 7 (seven) days., Disp: 4 capsule, Rfl: 0   meloxicam (MOBIC) 15 MG tablet, Take 15 mg by mouth daily. (Patient not taking: Reported on 03/23/2023), Disp: , Rfl:

## 2023-03-25 ENCOUNTER — Encounter: Payer: Self-pay | Admitting: Pulmonary Disease

## 2023-03-27 DIAGNOSIS — Z6841 Body Mass Index (BMI) 40.0 and over, adult: Secondary | ICD-10-CM | POA: Diagnosis not present

## 2023-03-27 DIAGNOSIS — Z7189 Other specified counseling: Secondary | ICD-10-CM | POA: Diagnosis not present

## 2023-03-27 DIAGNOSIS — F54 Psychological and behavioral factors associated with disorders or diseases classified elsewhere: Secondary | ICD-10-CM | POA: Diagnosis not present

## 2023-03-28 ENCOUNTER — Encounter: Payer: Self-pay | Admitting: Cardiology

## 2023-03-30 DIAGNOSIS — I10 Essential (primary) hypertension: Secondary | ICD-10-CM | POA: Diagnosis not present

## 2023-03-30 DIAGNOSIS — Z01818 Encounter for other preprocedural examination: Secondary | ICD-10-CM | POA: Diagnosis not present

## 2023-03-30 DIAGNOSIS — M545 Low back pain, unspecified: Secondary | ICD-10-CM | POA: Diagnosis not present

## 2023-03-30 DIAGNOSIS — R5383 Other fatigue: Secondary | ICD-10-CM | POA: Diagnosis not present

## 2023-03-31 DIAGNOSIS — R7309 Other abnormal glucose: Secondary | ICD-10-CM | POA: Diagnosis not present

## 2023-04-03 ENCOUNTER — Ambulatory Visit (HOSPITAL_COMMUNITY)
Admission: RE | Admit: 2023-04-03 | Discharge: 2023-04-03 | Disposition: A | Discharge status: Home / Self Care | Payer: BC Managed Care – PPO | Source: Ambulatory Visit | Attending: Cardiology | Admitting: Cardiology

## 2023-04-03 DIAGNOSIS — R0600 Dyspnea, unspecified: Secondary | ICD-10-CM | POA: Diagnosis not present

## 2023-04-03 DIAGNOSIS — E785 Hyperlipidemia, unspecified: Secondary | ICD-10-CM | POA: Insufficient documentation

## 2023-04-03 DIAGNOSIS — I119 Hypertensive heart disease without heart failure: Secondary | ICD-10-CM | POA: Insufficient documentation

## 2023-04-03 DIAGNOSIS — R0602 Shortness of breath: Secondary | ICD-10-CM | POA: Diagnosis not present

## 2023-04-04 DIAGNOSIS — M47816 Spondylosis without myelopathy or radiculopathy, lumbar region: Secondary | ICD-10-CM | POA: Diagnosis not present

## 2023-04-04 LAB — ECHOCARDIOGRAM COMPLETE
Area-P 1/2: 3.43 cm2
S' Lateral: 2.3 cm

## 2023-04-06 DIAGNOSIS — M47816 Spondylosis without myelopathy or radiculopathy, lumbar region: Secondary | ICD-10-CM | POA: Diagnosis not present

## 2023-04-11 DIAGNOSIS — M47816 Spondylosis without myelopathy or radiculopathy, lumbar region: Secondary | ICD-10-CM | POA: Diagnosis not present

## 2023-04-13 ENCOUNTER — Other Ambulatory Visit: Payer: BC Managed Care – PPO

## 2023-04-13 DIAGNOSIS — M47816 Spondylosis without myelopathy or radiculopathy, lumbar region: Secondary | ICD-10-CM | POA: Diagnosis not present

## 2023-04-18 DIAGNOSIS — M47816 Spondylosis without myelopathy or radiculopathy, lumbar region: Secondary | ICD-10-CM | POA: Diagnosis not present

## 2023-04-20 DIAGNOSIS — M47816 Spondylosis without myelopathy or radiculopathy, lumbar region: Secondary | ICD-10-CM | POA: Diagnosis not present

## 2023-04-27 ENCOUNTER — Encounter: Payer: Self-pay | Admitting: Cardiology

## 2023-04-27 ENCOUNTER — Ambulatory Visit: Payer: BC Managed Care – PPO | Admitting: Cardiology

## 2023-04-27 VITALS — BP 155/83 | HR 76 | Resp 12 | Ht 65.0 in | Wt 351.4 lb

## 2023-04-27 DIAGNOSIS — Z789 Other specified health status: Secondary | ICD-10-CM | POA: Diagnosis not present

## 2023-04-27 DIAGNOSIS — I10 Essential (primary) hypertension: Secondary | ICD-10-CM

## 2023-04-27 DIAGNOSIS — R0602 Shortness of breath: Secondary | ICD-10-CM

## 2023-04-27 DIAGNOSIS — I700 Atherosclerosis of aorta: Secondary | ICD-10-CM

## 2023-04-27 DIAGNOSIS — Z01810 Encounter for preprocedural cardiovascular examination: Secondary | ICD-10-CM

## 2023-04-27 DIAGNOSIS — M47816 Spondylosis without myelopathy or radiculopathy, lumbar region: Secondary | ICD-10-CM | POA: Diagnosis not present

## 2023-04-27 DIAGNOSIS — I2510 Atherosclerotic heart disease of native coronary artery without angina pectoris: Secondary | ICD-10-CM

## 2023-04-27 DIAGNOSIS — E785 Hyperlipidemia, unspecified: Secondary | ICD-10-CM | POA: Diagnosis not present

## 2023-04-27 DIAGNOSIS — E782 Mixed hyperlipidemia: Secondary | ICD-10-CM

## 2023-04-27 MED ORDER — ASPIRIN 81 MG PO TBEC: 81.0000 mg | DELAYED_RELEASE_TABLET | Freq: Every day | ORAL | 0 refills | Status: DC

## 2023-04-27 MED ORDER — WEGOVY 0.25 MG/0.5ML ~~LOC~~ SOAJ: 0.2500 mg | SUBCUTANEOUS | 0 refills | Status: DC

## 2023-04-27 MED ORDER — ATORVASTATIN CALCIUM 20 MG PO TABS: 20.0000 mg | ORAL_TABLET | Freq: Every day | ORAL | 3 refills | Status: DC

## 2023-04-27 NOTE — Progress Notes (Signed)
ID:  Jill Palmer, DOB Oct 20, 1966, MRN [id number]  PCP:  Laurann Montana, MD  Cardiologist:  Tessa Lerner, DO, [facility] (established care 03/13/2023) Former Cardiology Providers: Dr. Elease Hashimoto Pulmonary Medicine Providers: Dr. Francine Graven  Date: 04/27/23 Last Office Visit: 03/13/2023  Chief Complaint  Patient presents with   Results    Echocardiogram and calcium score test   Follow-up    6 weeks    HPI  Jill Palmer is a 57 y.o. 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  It was felt that her shortness of breath was multifactorial but did not raise a concern for possible cardiovascular comorbidities.  Patient was recommended to undergo echo and coronary artery calcification results reviewed with her in detail and noted below for further reference.  Clinically she denies anginal chest pain or heart failure symptoms.  We discussed management of coronary artery calcification.  Will hold off on stress testing at this time until closer to bariatric surgery.  However, if she has new onset of chest pain or worsening shortness of breath stress test or coronary CTA can be performed sooner.  Patient is agreeable with the plan of care.  She is also being considered for bariatric surgery at [facility] health weight loss clinic.  However prior to surgical intervention patient is advised to lose 40 to 50 pounds.  She is also seen pulmonary medicine since last office visit and started on inhalers which has helped her overall dyspnea as well.  FUNCTIONAL STATUS: She  goes to the gym 3 to 4 days a week, does a stationary bike for about 4 miles.  Some resistance training with free weights.  ALLERGIES: Allergies  Allergen Reactions   Septra [Sulfamethoxazole-Trimethoprim] Rash   Latex Itching   Penicillins Itching   Tessalon Perles [Benzonatate] Other (See Comments)    Muscle weakness    Meloxicam Rash    MEDICATION LIST PRIOR TO VISIT: Current Meds  Medication Sig   aspirin EC 81 MG tablet Take 1 tablet (81 mg total) by mouth daily. Swallow whole.   atorvastatin (LIPITOR) 20 MG tablet Take 1 tablet (20 mg total) by mouth at bedtime.   Cholecalciferol 100 MCG (4000 UT) CAPS Take 1 capsule by mouth daily.   meloxicam (MOBIC) 15 MG tablet Take 15 mg by mouth daily.   metoprolol succinate (TOPROL-XL) 50 MG 24 hr tablet Take 50 mg by mouth daily. Take with or immediately following a meal.   Multiple Vitamin (MULTIVITAMIN WITH MINERALS) TABS Take 1 tablet by mouth every morning.    Semaglutide-Weight Management (WEGOVY) 0.25 MG/0.5ML SOAJ Inject 0.25 mg into the skin once a week for 8 doses.   sertraline (ZOLOFT) 25 MG tablet Take 50 mg by mouth daily.   triamterene-hydrochlorothiazide (MAXZIDE-25) 37.5-25 MG per tablet Take 1 tablet every morning by mouth. For fluid     PAST MEDICAL HISTORY: Past Medical History:  Diagnosis Date   Anemia    Anxiety    Back pain    Bone spur    Breast cancer (HCC)    Bursitis of right foot    Chest pain    Edema    GERD (gastroesophageal reflux  disease)    Hernia, hiatal    Hypertension    Joint pain    Lipedema    Lymphedema of lower extremity    Osteoarthritis    Palpitations    Soft tissue infection    left leg   Thyroid disease    Venous stasis     PAST SURGICAL HISTORY: Past Surgical History:  Procedure Laterality Date   ABDOMINAL HYSTERECTOMY     BREAST LUMPECTOMY     EYE SURGERY     PARTIAL HYSTERECTOMY     TONSILLECTOMY    Strabismus surgery. Myomectomy x 2. Hysterectomy without  oophorectomy. Lumpectomy  FAMILY HISTORY: The patient family history includes Anxiety disorder in her mother; Asthma in her brother and mother; Bell's palsy in her father; Bone cancer in her paternal uncle; Breast cancer in an other family member; CAD in her father, mother, paternal grandfather, and paternal uncle; COPD in her mother; CVA in her mother; Colon cancer in her maternal grandmother and paternal uncle; Colon polyps in an other family member; Depression in her mother; Edema in her sister; Heart failure in her father and mother; Hypertension in her brother, mother, and sister; Obesity in her mother; Other in her brother; Paranoid behavior in her mother; Rheum arthritis in an other family member; Schizophrenia in her mother; Sudden death in her father.  SOCIAL HISTORY:  The patient  reports that she has never smoked. She has never used smokeless tobacco. She reports current alcohol use. She reports that she does not use drugs.  REVIEW OF SYSTEMS: Review of Systems  Cardiovascular:  Negative for chest pain, claudication, dyspnea on exertion, irregular heartbeat, near-syncope, palpitations and syncope. Leg swelling: Lymphedema. Respiratory:  Positive for shortness of breath.   Hematologic/Lymphatic: Negative for bleeding problem.  Musculoskeletal:  Negative for muscle cramps and myalgias.  Neurological:  Negative for dizziness and light-headedness.    PHYSICAL EXAM:    04/27/2023    1:45 PM 03/23/2023    3:42 PM 03/13/2023   12:46 PM  Vitals with BMI  Height 5\' 5"  5\' 5"  5\' 5"   Weight 351 lbs 6 oz 349 lbs 356 lbs 13 oz  BMI 58.48 58.08 59.37  Systolic 155 118 161  Diastolic 83 76 89  Pulse 76 71 88    Physical Exam  Constitutional: No distress.  Appears older than stated age, hemodynamically stable  Neck:  Unable to evaluate JVP due to adipose tissue and short neck stature  Cardiovascular: Normal rate, regular rhythm, S1 normal, S2 normal, intact distal pulses and normal  pulses. Exam reveals no gallop, no S3 and no S4.  Murmur heard. Systolic murmur is present with a grade of 3/6 at the upper right sternal border. Unable to evaluate distal pulses due to adipose tissue/lymphedema  Pulmonary/Chest: Effort normal and breath sounds normal. No stridor. She has no wheezes. She has no rales.  Abdominal: Soft. Bowel sounds are normal. She exhibits no distension. There is no abdominal tenderness.  Abdominal obesity  Musculoskeletal:        General: Edema (Lymphedema bilaterally) present.     Cervical back: Neck supple.  Neurological: She is alert and oriented to person, place, and time. She has intact cranial nerves (2-12).  Skin: Skin is warm and moist.   CARDIAC DATABASE: EKG: Mar 13, 2023: Sinus rhythm, 73 bpm, normal axis, LAE, without underlying ischemia or injury pattern.  Echocardiogram: 09/06/2018 - Left ventricle: The cavity size was normal. Systolic function was    normal. The estimated ejection  fraction was in the range of 60%    to 65%. Left ventricular diastolic function parameters were    normal.  - Aortic valve: Transvalvular velocity was within the normal range.    There was no stenosis. There was no regurgitation.  - Mitral valve: Transvalvular velocity was within the normal range.    There was no evidence for stenosis. There was trivial    regurgitation.  - Tricuspid valve: There was trivial regurgitation.    Stress Testing: No results found for this or any previous visit from the past 1095 days.   Heart Catheterization: None  LABORATORY DATA: External Labs: Collected: 02/22/2023 BUN 16, creatinine 0.85. eGFR 79. Sodium 140, potassium 4.3, chloride 103, bicarb 30. Hemoglobin 14.1, hematocrit 43.5  Collected: 08/23/2022 provided by PCP Total cholesterol 197, triglycerides 139, HDL 55, LDL calculated 118, non-HDL 142   IMPRESSION:    ICD-10-CM   1. Coronary artery calcification seen on computed tomography  I25.10  Semaglutide-Weight Management (WEGOVY) 0.25 MG/0.5ML SOAJ    aspirin EC 81 MG tablet    atorvastatin (LIPITOR) 20 MG tablet    2. Atherosclerosis of aorta (HCC)  I70.0     3. Statin intolerance  Z78.9     4. Mixed hyperlipidemia  E78.2     5. Shortness of breath on exertion  R06.02     6. Preop cardiovascular exam  Z01.810     7. Benign hypertension  I10     8. Class 3 severe obesity due to excess calories with serious comorbidity and body mass index (BMI) of 50.0 to 59.9 in adult [facility])  E66.01 Semaglutide-Weight Management (WEGOVY) 0.25 MG/0.5ML SOAJ   Z68.43        RECOMMENDATIONS: NARA GOTWALT is a 57 y.o. 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 Start aspirin 81 mg p.o. daily, have asked her to discuss it further with her bariatric surgeon with regards to appropriateness.  Patient is concerned that she cannot be on aspirin if she is considering bariatric surgery. Echo: LVEF 60-65%, normal diastolic function, no significant valvular heart disease. With regards to ischemic workup since she is asymptomatic we will hold off.  She plans to lose 40 to 50 pounds prior to her bypass surgery and at that time we could risk stratify her prior to surgical intervention.  This will expose her to the last radiation whether we go the stress test route or coronary CTA direction.  Patient is agreeable with the plan of care  Statin intolerance Mixed hyperlipidemia Patient is willing to retry statin therapy. Rosuvastatin (muscle weakness), atorvastatin (hair loss.),  pravastatin (edema). I also spoke to her regarding nonstatin medications including Zetia, Nexletol. Patient is willing to try atorvastatin -as it will be more cost efficient. Start atorvastatin 20 mg p.o. nightly. Labs in 6 weeks to reevaluate therapy if she is able to tolerate the medications.  She will call us back. If she is not able to tolerate Lipitor we could consider PCSK9 inhibitors.-Patient is apprehensive as these medications may not be covered well by her insurance company.   Shortness of breath on exertion Chronic and stable. Multifactorial. Has improved after initiation of inhalers by pulmonary medicine.  Reemphasized importance of improving her modifiable cardiovascular risk factors.  Preop cardiovascular exam Plans to have weight loss surgery for once she has lost 40 to 50 pounds.  Sees Dr. Adolphus Birchwood at [facility].  EKG is nonischemic Her symptoms are not suggestive of ischemic equivalents.  Recommend increasing physical activity as tolerated.  Whether she needs ischemic workup will be determined closer to her date of surgery.   Benign hypertension Office blood pressures are not well-controlled. In the past patient endorsed that home blood pressure closer to 130/80 on current medical regimen. We have the importance of low-salt diet  Class 3 severe obesity due to excess calories with serious comorbidity and body mass index (BMI) of 50.0 to 59.9 in adult [facility]) Given her class III obesity, coronary artery calcification, reemphasized importance of improving her modifiable cardiovascular risk factors. Given the new findings of coronary artery atherosclerosis will prescribe Wegovy. If and when Reginal Lutes is approved she will come in for medication administration with nursing staff  FINAL MEDICATION LIST END OF ENCOUNTER: Meds ordered this encounter  Medications   Semaglutide-Weight Management (WEGOVY) 0.25 MG/0.5ML SOAJ    Sig: Inject 0.25 mg into the skin once a week for 8  doses.    Dispense:  4 mL    Refill:  0   aspirin EC 81 MG tablet    Sig: Take 1 tablet (81 mg total) by mouth daily. Swallow whole.    Dispense:  90 tablet    Refill:  0   atorvastatin (LIPITOR) 20 MG tablet    Sig: Take 1 tablet (20 mg total) by mouth at bedtime.    Dispense:  90 tablet    Refill:  3    Medications Discontinued During This Encounter  Medication Reason   Vitamin D, Ergocalciferol, (DRISDOL) 1.25 MG (50000 UT) CAPS capsule Dose change     Current Outpatient Medications:    aspirin EC 81 MG tablet, Take 1 tablet (81 mg total) by mouth daily. Swallow whole., Disp: 90 tablet, Rfl: 0   atorvastatin (LIPITOR) 20 MG tablet, Take 1 tablet (20 mg total) by mouth at bedtime., Disp: 90 tablet, Rfl: 3   Cholecalciferol 100 MCG (4000 UT) CAPS, Take 1 capsule by mouth daily., Disp: , Rfl:    meloxicam (MOBIC) 15 MG tablet, Take 15 mg by mouth daily., Disp: , Rfl:    metoprolol succinate (TOPROL-XL) 50 MG 24 hr tablet, Take 50 mg by mouth daily. Take with or immediately following a meal., Disp: , Rfl:    Multiple Vitamin (MULTIVITAMIN WITH MINERALS) TABS, Take 1 tablet by mouth every morning. , Disp: , Rfl:    Semaglutide-Weight Management (WEGOVY) 0.25 MG/0.5ML SOAJ, Inject 0.25 mg into the skin once a week for 8 doses., Disp: 4 mL, Rfl: 0   sertraline (ZOLOFT) 25 MG tablet, Take 50 mg by mouth daily., Disp: , Rfl:    triamterene-hydrochlorothiazide (MAXZIDE-25) 37.5-25 MG per tablet, Take 1 tablet every morning by mouth. For fluid, Disp: , Rfl:    fluticasone-salmeterol (ADVAIR HFA) 115-21 MCG/ACT inhaler, Inhale 2 puffs into the lungs 2 (two) times daily. (Patient not taking: Reported on 04/27/2023), Disp: 1 each, Rfl: 12  No orders of the defined types were placed in this encounter.   There are no Patient Instructions on file for this visit.   --Continue cardiac medications as reconciled in final medication list. --Return in about 3 months (around 07/28/2023) for Follow up,  Coronary artery calcification. or sooner if needed. --Continue follow-up with your primary  care physician regarding the management of your other chronic comorbid conditions.  Patient's questions and concerns were addressed to her satisfaction. She voices understanding of the instructions provided during this encounter.   This note was created using a voice recognition software as a result there may be grammatical errors inadvertently enclosed that do not reflect the nature of this encounter. Every attempt is made to correct such errors.  Tessa Lerner, [place], [facility]  Pager:  [phone] Office: [phone]

## 2023-04-30 DIAGNOSIS — R7309 Other abnormal glucose: Secondary | ICD-10-CM | POA: Diagnosis not present

## 2023-05-01 DIAGNOSIS — C50512 Malignant neoplasm of lower-outer quadrant of left female breast: Secondary | ICD-10-CM | POA: Diagnosis not present

## 2023-05-01 DIAGNOSIS — N6323 Unspecified lump in the left breast, lower outer quadrant: Secondary | ICD-10-CM | POA: Diagnosis not present

## 2023-05-01 DIAGNOSIS — Z171 Estrogen receptor negative status [ER-]: Secondary | ICD-10-CM | POA: Diagnosis not present

## 2023-05-04 ENCOUNTER — Encounter: Payer: Self-pay | Admitting: Cardiology

## 2023-05-04 DIAGNOSIS — M47816 Spondylosis without myelopathy or radiculopathy, lumbar region: Secondary | ICD-10-CM | POA: Diagnosis not present

## 2023-05-07 DIAGNOSIS — Z853 Personal history of malignant neoplasm of breast: Secondary | ICD-10-CM | POA: Diagnosis not present

## 2023-05-07 DIAGNOSIS — N6323 Unspecified lump in the left breast, lower outer quadrant: Secondary | ICD-10-CM | POA: Diagnosis not present

## 2023-05-07 DIAGNOSIS — R92323 Mammographic fibroglandular density, bilateral breasts: Secondary | ICD-10-CM | POA: Diagnosis not present

## 2023-05-07 DIAGNOSIS — N6325 Unspecified lump in the left breast, overlapping quadrants: Secondary | ICD-10-CM | POA: Diagnosis not present

## 2023-05-08 ENCOUNTER — Telehealth: Payer: Self-pay

## 2023-05-08 NOTE — Telephone Encounter (Signed)
Patients wegovy was denied. Her insurance does not cover any weight loss meds , spoke with rep about it as well. Will make patient aware

## 2023-05-08 NOTE — Telephone Encounter (Signed)
Delice Bison,  Talk to rep and see if it could get approved given her CAC. Icd code i25.84.  Dr. Odis Hollingshead

## 2023-05-09 NOTE — Telephone Encounter (Signed)
The rep was here its her insurance, no weight loss meds, we even looked to see if she was diabetic which she is not.

## 2023-05-11 DIAGNOSIS — N649 Disorder of breast, unspecified: Secondary | ICD-10-CM | POA: Diagnosis not present

## 2023-05-11 DIAGNOSIS — N6323 Unspecified lump in the left breast, lower outer quadrant: Secondary | ICD-10-CM | POA: Diagnosis not present

## 2023-05-11 DIAGNOSIS — N6324 Unspecified lump in the left breast, lower inner quadrant: Secondary | ICD-10-CM | POA: Diagnosis not present

## 2023-05-11 DIAGNOSIS — N641 Fat necrosis of breast: Secondary | ICD-10-CM | POA: Diagnosis not present

## 2023-05-14 NOTE — Telephone Encounter (Signed)
Please update the patient.   Maeby Vankleeck [place], DO, [facility]

## 2023-05-15 NOTE — Telephone Encounter (Signed)
error 

## 2023-05-29 DIAGNOSIS — Z6841 Body Mass Index (BMI) 40.0 and over, adult: Secondary | ICD-10-CM | POA: Diagnosis not present

## 2023-05-29 DIAGNOSIS — Z171 Estrogen receptor negative status [ER-]: Secondary | ICD-10-CM | POA: Diagnosis not present

## 2023-05-29 DIAGNOSIS — I10 Essential (primary) hypertension: Secondary | ICD-10-CM | POA: Diagnosis not present

## 2023-05-29 DIAGNOSIS — C50512 Malignant neoplasm of lower-outer quadrant of left female breast: Secondary | ICD-10-CM | POA: Diagnosis not present

## 2023-05-31 DIAGNOSIS — R7309 Other abnormal glucose: Secondary | ICD-10-CM | POA: Diagnosis not present

## 2023-06-06 DIAGNOSIS — I2510 Atherosclerotic heart disease of native coronary artery without angina pectoris: Secondary | ICD-10-CM | POA: Diagnosis not present

## 2023-06-06 DIAGNOSIS — R1012 Left upper quadrant pain: Secondary | ICD-10-CM | POA: Diagnosis not present

## 2023-06-06 DIAGNOSIS — R109 Unspecified abdominal pain: Secondary | ICD-10-CM | POA: Diagnosis not present

## 2023-06-13 ENCOUNTER — Encounter: Payer: Self-pay | Admitting: Pulmonary Disease

## 2023-06-13 ENCOUNTER — Ambulatory Visit (INDEPENDENT_AMBULATORY_CARE_PROVIDER_SITE_OTHER): Payer: BC Managed Care – PPO | Admitting: Pulmonary Disease

## 2023-06-13 ENCOUNTER — Ambulatory Visit: Payer: BC Managed Care – PPO | Admitting: Pulmonary Disease

## 2023-06-13 VITALS — BP 128/82 | HR 85 | Temp 98.0°F | Ht 64.0 in | Wt 356.0 lb

## 2023-06-13 DIAGNOSIS — J4520 Mild intermittent asthma, uncomplicated: Secondary | ICD-10-CM | POA: Diagnosis not present

## 2023-06-13 DIAGNOSIS — R0602 Shortness of breath: Secondary | ICD-10-CM

## 2023-06-13 LAB — PULMONARY FUNCTION TEST
DL/VA % pred: 145 %
DL/VA: 6.14 ml/min/mmHg/L
DLCO cor % pred: 105 %
DLCO cor: 21.42 ml/min/mmHg
DLCO unc % pred: 105 %
DLCO unc: 21.42 ml/min/mmHg
FEF 25-75 Post: 2.66 L/s
FEF 25-75 Pre: 3.31 L/s
FEF2575-%Change-Post: -19 %
FEF2575-%Pred-Post: 109 %
FEF2575-%Pred-Pre: 135 %
FEV1-%Change-Post: -5 %
FEV1-%Pred-Post: 79 %
FEV1-%Pred-Pre: 84 %
FEV1-Post: 2.08 L
FEV1-Pre: 2.21 L
FEV1FVC-%Change-Post: 0 %
FEV1FVC-%Pred-Pre: 113 %
FEV6-%Change-Post: -6 %
FEV6-%Pred-Post: 71 %
FEV6-%Pred-Pre: 76 %
FEV6-Post: 2.32 L
FEV6-Pre: 2.47 L
FEV6FVC-%Pred-Post: 103 %
FEV6FVC-%Pred-Pre: 103 %
FVC-%Change-Post: -6 %
FVC-%Pred-Post: 69 %
FVC-%Pred-Pre: 73 %
FVC-Post: 2.32 L
FVC-Pre: 2.47 L
Post FEV1/FVC ratio: 90 %
Post FEV6/FVC ratio: 100 %
Pre FEV1/FVC ratio: 89 %
Pre FEV6/FVC Ratio: 100 %
RV % pred: 105 %
RV: 2.06 L
TLC % pred: 87 %
TLC: 4.42 L

## 2023-06-13 NOTE — Patient Instructions (Addendum)
Advair HFA 115-21 mcg 2 puffs twice daily - rinse mouth out after each use  Your breathing tests are within normal limits  Please follow up as needed

## 2023-06-13 NOTE — Progress Notes (Signed)
Full PFT performed today. °

## 2023-06-13 NOTE — Progress Notes (Signed)
Synopsis: Referred in May 2024 for shortness of breath by Jill Montana, MD  Subjective:   PATIENT ID: Jill Palmer GENDER: female DOB: 10-26-1966, MRN: [id number]  HPI  Chief Complaint  Patient presents with   Follow-up    SOB x 2 weeks  PFT 06/13/23   Jill Palmer is a 57 year old woman, never smoker with GERD and hypertension who is referred for shortness of breath.   Initial OV 03/23/23 Patient reports dyspnea on exertion over the past year.  The dyspnea is noted with walking or when she is exerting herself to feed her cat.  She is currently working out using a recumbent bike and can ride 4 to 7 miles continuously which can be up to 33 minutes in time.  She is also doing 3 days of strength training per week.  She is currently working on weight loss through the [employer] management clinic.  She is not able to qualify for weight loss drug based on her insurance coverage.  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 She had radiation therapy 5 concentrated every other day treatments for 2 weeks. Completed in February 2024.  She gained about 20 pounds since her diagnosis.  She notices increased cough and heart palpitation symptoms during stressful times.  She notices intermittent wheezing when laying down for bed at night.  Work is currently stressful at the moment.  She denies significant snoring history but has awakened herself due to snoring episodes at times.  She normally feels rested in the morning time and denies any excessive afternoon sleepiness.  She has recently undergone cardiac evaluation for the shortness of breath.  She understands she is overweight and this can contribute to her shortness of breath that she wishes to make sure nothing else is going on with her lungs.  She was evaluated by cardiology 03/13/2023 with plans for echocardiogram and CT  cardiac scoring scan.  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  No history of occupational dust or chemical exposures.  She is a never smoker.  Today 06/13/2023 She has been doing well since last visit.  She reports some improvement with Advair HFA 115-21 mcg 2 puffs twice daily.  PFTs today are within normal limits.  She has been increasing her physical activity and did note some weight loss with less dyspnea with exertion.  Past Medical History:  Diagnosis Date   Anemia    Anxiety    Back pain    Bone spur    Breast cancer (HCC)    Bursitis of right foot    Chest pain    Edema    GERD (gastroesophageal reflux disease)    Hernia, hiatal    Hypertension    Joint pain    Lipedema    Lymphedema of lower extremity    Osteoarthritis    Palpitations    Soft tissue infection    left leg   Thyroid disease    Venous stasis      Family History  Problem Relation Age of Onset   Heart failure Mother    CAD Mother    CVA Mother    Hypertension Mother    Asthma Mother    COPD Mother  Depression Mother    Paranoid behavior Mother    Schizophrenia Mother    Anxiety disorder Mother    Obesity Mother    Heart failure Father    CAD Father    Bell's palsy Father    Sudden death Father    Hypertension Sister    Edema Sister    Hypertension Brother    Asthma Brother    Colon cancer Maternal Grandmother    CAD Paternal Grandfather    Other Brother        MVA   CAD Paternal Uncle    Bone cancer Paternal Uncle    Colon cancer Paternal Uncle    Breast cancer Other        aunt/82, 1st cousin/40's, 2nd cousin/30   Rheum arthritis Other        2 aunts, 2 uncles   Colon polyps Other        1 uncle   Liver disease Neg Hx      Social History   Socioeconomic History   Marital status: Single    Spouse name: Not on file   Number of  children: Not on file   Years of education: Not on file   Highest education level: Not on file  Occupational History   Occupation: Interior and spatial designer  Tobacco Use   Smoking status: Never   Smokeless tobacco: Never  Vaping Use   Vaping status: Never Used  Substance and Sexual Activity   Alcohol use: Yes    Comment: maybe once or twice a year   Drug use: No   Sexual activity: Not Currently    Birth control/protection: Surgical  Other Topics Concern   Not on file  Social History Narrative   Not on file   Social Determinants of Health   Financial Resource Strain: Low Risk  (10/31/2022)   Received from [facility], [facility]   Overall Financial Resource Strain (CARDIA)    Difficulty of Paying Living Expenses: Not very hard  Food Insecurity: No Food Insecurity (10/31/2022)   Received from [facility], [facility]   Hunger Vital Sign    Worried About Running Out of Food in the Last Year: Never true    Ran Out of Food in the Last Year: Never true  Transportation Needs: No Transportation Needs (10/31/2022)   Received from [facility], [facility]   PRAPARE - Transportation    Lack of Transportation (Medical): No    Lack of Transportation (Non-Medical): No  Physical Activity: Insufficiently Active (10/31/2022)   Received from [facility], [facility]   Exercise Vital Sign    Days of Exercise per Week: 4 days    Minutes of Exercise per Session: 30 min  Stress: No Stress Concern Present (10/31/2022)   Received from [facility], [facility] - Occupational Stress Questionnaire    Feeling of Stress : Not at all  Social Connections: Moderately Integrated (10/31/2022)   Received from [facility], [facility]   Social Network    How would you rate your social network (family, work, friends)?: Adequate participation with social networks  Intimate Partner Violence: Not At Risk (10/31/2022)   Received from [facility], [facility]    HITS    Over the last 12 months how often did your partner physically hurt you?: 1    Over the last 12 months how often did your partner insult you or talk down to you?: 1    Over the last  12 months how often did your partner threaten you with physical harm?: 1    Over the last 12 months how often did your partner scream or curse at you?: 1     Allergies  Allergen Reactions   Septra [Sulfamethoxazole-Trimethoprim] Rash   Latex Itching   Penicillins Itching   Tessalon Perles [Benzonatate] Other (See Comments)    Muscle weakness    Meloxicam Rash     Outpatient Medications Prior to Visit  Medication Sig Dispense Refill   aspirin EC 81 MG tablet Take 1 tablet (81 mg total) by mouth daily. Swallow whole. 90 tablet 0   atorvastatin (LIPITOR) 20 MG tablet Take 1 tablet (20 mg total) by mouth at bedtime. 90 tablet 3   Cholecalciferol 100 MCG (4000 UT) CAPS Take 1 capsule by mouth daily.     fluticasone-salmeterol (ADVAIR HFA) 115-21 MCG/ACT inhaler Inhale 2 puffs into the lungs 2 (two) times daily. 1 each 12   meloxicam (MOBIC) 15 MG tablet Take 15 mg by mouth daily.     metoprolol succinate (TOPROL-XL) 50 MG 24 hr tablet Take 50 mg by mouth daily. Take with or immediately following a meal.     Multiple Vitamin (MULTIVITAMIN WITH MINERALS) TABS Take 1 tablet by mouth every morning.      sertraline (ZOLOFT) 25 MG tablet Take 50 mg by mouth daily.     triamterene-hydrochlorothiazide (MAXZIDE-25) 37.5-25 MG per tablet Take 1 tablet every morning by mouth. For fluid     No facility-administered medications prior to visit.    Review of Systems  Constitutional:  Negative for chills, fever, malaise/fatigue and weight loss.  HENT:  Negative for congestion, sinus pain and sore throat.   Eyes: Negative.   Respiratory:  Positive for shortness of breath. Negative for cough, hemoptysis, sputum production and wheezing.   Cardiovascular:  Positive for leg swelling (lipedema of legs). Negative for  chest pain, palpitations, orthopnea and claudication.  Gastrointestinal:  Negative for abdominal pain, heartburn, nausea and vomiting.  Genitourinary: Negative.   Musculoskeletal:  Negative for joint pain and myalgias.  Skin:  Negative for rash.  Neurological:  Negative for weakness.  Endo/Heme/Allergies: Negative.   Psychiatric/Behavioral: Negative.     Objective:   Vitals:   06/13/23 1303  BP: 128/82  Pulse: 85  Temp: 98 F (36.7 C)  TempSrc: Temporal  SpO2: 95%  Weight: (!) 356 lb (161.5 kg)  Height: 5\' 4"  (1.626 m)   Physical Exam Constitutional:      General: She is not in acute distress.    Appearance: She is obese. She is not ill-appearing.  HENT:     Head: Normocephalic and atraumatic.  Eyes:     General: No scleral icterus.    Conjunctiva/sclera: Conjunctivae normal.  Cardiovascular:     Rate and Rhythm: Normal rate and regular rhythm.     Pulses: Normal pulses.     Heart sounds: Normal heart sounds. No murmur heard. Pulmonary:     Effort: Pulmonary effort is normal.     Breath sounds: Normal breath sounds. No wheezing, rhonchi or rales.  Musculoskeletal:        General: Swelling (lipedema bilaterally) present.  Skin:    General: Skin is warm and dry.  Neurological:     General: No focal deficit present.     Mental Status: She is alert.    CBC    Component Value Date/Time   WBC 6.1 08/28/2018 0919   WBC 10.1 02/16/2013 0004   RBC 5.05  08/28/2018 0919   RBC 5.35 (H) 02/16/2013 0004   HGB 14.0 08/28/2018 0919   HCT 41.3 08/28/2018 0919   PLT 251 02/16/2013 0004   MCV 82 08/28/2018 0919   MCH 27.7 08/28/2018 0919   MCH 27.3 02/16/2013 0004   MCHC 33.9 08/28/2018 0919   MCHC 33.4 02/16/2013 0004   RDW 13.9 08/28/2018 0919   LYMPHSABS 1.2 08/28/2018 0919   MONOABS 0.8 02/16/2013 0004   EOSABS 0.2 08/28/2018 0919   BASOSABS 0.1 08/28/2018 0919      Latest Ref Rng & Units 08/28/2018    9:19 AM 02/16/2013   12:04 AM 09/29/2012    8:10 PM  BMP   Glucose 65 - 99 mg/dL 85  96  76   BUN 6 - 24 mg/dL 19  16  14    Creatinine 0.57 - 1.00 mg/dL 1.61  0.96  0.45   BUN/Creat Ratio 9 - 23 25     Sodium 134 - 144 mmol/L 141  136  138   Potassium 3.5 - 5.2 mmol/L 4.5  3.8  3.6   Chloride 96 - 106 mmol/L 99  101  102   CO2 20 - 29 mmol/L 27  25  28    Calcium 8.7 - 10.2 mg/dL 9.3  9.6  9.0    Chest imaging: CXR 02/23/23 The heart size and mediastinal contours are within normal limits. Both lungs are clear. The visualized skeletal structures are unremarkable.  PFT:    Latest Ref Rng & Units 06/13/2023   12:08 PM  PFT Results  FVC-Pre L 2.47  P  FVC-Predicted Pre % 73  P  FVC-Post L 2.32  P  FVC-Predicted Post % 69  P  Pre FEV1/FVC % % 89  P  Post FEV1/FCV % % 90  P  FEV1-Pre L 2.21  P  FEV1-Predicted Pre % 84  P  FEV1-Post L 2.08  P  DLCO uncorrected ml/min/mmHg 21.42  P  DLCO UNC% % 105  P  DLCO corrected ml/min/mmHg 21.42  P  DLCO COR %Predicted % 105  P  DLVA Predicted % 145  P  TLC L 4.42  P  TLC % Predicted % 87  P  RV % Predicted % 105  P    P Preliminary result    Labs:  Path:  Echo: 2019: EF 60%. Not able to assess RV.   Heart Catheterization:  Assessment & Plan:   Mild intermittent reactive airway disease without complication  Discussion: Lamanda Wahlman is a 57 year old woman, never smoker with GERD and hypertension who returns for shortness of breath.   Her pulmonary function tests are within normal limits.  She does appear to have a positive response to ICS/LABA inhaler trial and is to continue Advair HFA 115-21 mcg 2 puffs twice daily.  She is to continue to work on physical activity and weight loss as her dyspnea is likely due to obesity and deconditioning.  Follow-up as needed.  Melody Comas, MD [facility] Office: [phone]   Current Outpatient Medications:    aspirin EC 81 MG tablet, Take 1 tablet (81 mg total) by mouth daily. Swallow whole., Disp: 90 tablet,  Rfl: 0   atorvastatin (LIPITOR) 20 MG tablet, Take 1 tablet (20 mg total) by mouth at bedtime., Disp: 90 tablet, Rfl: 3   Cholecalciferol 100 MCG (4000 UT) CAPS, Take 1 capsule by mouth daily., Disp: , Rfl:    fluticasone-salmeterol (ADVAIR HFA) 115-21 MCG/ACT inhaler, Inhale 2  puffs into the lungs 2 (two) times daily., Disp: 1 each, Rfl: 12   meloxicam (MOBIC) 15 MG tablet, Take 15 mg by mouth daily., Disp: , Rfl:    metoprolol succinate (TOPROL-XL) 50 MG 24 hr tablet, Take 50 mg by mouth daily. Take with or immediately following a meal., Disp: , Rfl:    Multiple Vitamin (MULTIVITAMIN WITH MINERALS) TABS, Take 1 tablet by mouth every morning. , Disp: , Rfl:    sertraline (ZOLOFT) 25 MG tablet, Take 50 mg by mouth daily., Disp: , Rfl:    triamterene-hydrochlorothiazide (MAXZIDE-25) 37.5-25 MG per tablet, Take 1 tablet every morning by mouth. For fluid, Disp: , Rfl:

## 2023-06-13 NOTE — Patient Instructions (Signed)
Full PFT performed today. °

## 2023-06-16 ENCOUNTER — Encounter: Payer: Self-pay | Admitting: Pulmonary Disease

## 2023-07-01 DIAGNOSIS — R7309 Other abnormal glucose: Secondary | ICD-10-CM | POA: Diagnosis not present

## 2023-07-26 ENCOUNTER — Other Ambulatory Visit: Payer: Self-pay | Admitting: Cardiology

## 2023-07-26 DIAGNOSIS — I2510 Atherosclerotic heart disease of native coronary artery without angina pectoris: Secondary | ICD-10-CM

## 2023-07-30 ENCOUNTER — Ambulatory Visit: Payer: BC Managed Care – PPO | Admitting: Cardiology

## 2023-07-30 ENCOUNTER — Telehealth: Payer: Self-pay | Admitting: Cardiology

## 2023-07-30 NOTE — Telephone Encounter (Signed)
I am okay.  Thank-you for the update.   Dr. Odis Hollingshead

## 2023-07-30 NOTE — Telephone Encounter (Signed)
Per [employer] message pt would like to to a provider Switch:  From: Odis Hollingshead  To: Jacinto Halim

## 2023-07-31 DIAGNOSIS — R7309 Other abnormal glucose: Secondary | ICD-10-CM | POA: Diagnosis not present

## 2023-08-29 DIAGNOSIS — I2510 Atherosclerotic heart disease of native coronary artery without angina pectoris: Secondary | ICD-10-CM | POA: Diagnosis not present

## 2023-08-29 DIAGNOSIS — Z171 Estrogen receptor negative status [ER-]: Secondary | ICD-10-CM | POA: Diagnosis not present

## 2023-08-29 DIAGNOSIS — E785 Hyperlipidemia, unspecified: Secondary | ICD-10-CM | POA: Diagnosis not present

## 2023-08-29 DIAGNOSIS — C50512 Malignant neoplasm of lower-outer quadrant of left female breast: Secondary | ICD-10-CM | POA: Diagnosis not present

## 2023-08-29 DIAGNOSIS — I10 Essential (primary) hypertension: Secondary | ICD-10-CM | POA: Diagnosis not present

## 2023-08-29 DIAGNOSIS — Z0000 Encounter for general adult medical examination without abnormal findings: Secondary | ICD-10-CM | POA: Diagnosis not present

## 2023-08-29 DIAGNOSIS — E559 Vitamin D deficiency, unspecified: Secondary | ICD-10-CM | POA: Diagnosis not present

## 2023-08-29 DIAGNOSIS — I700 Atherosclerosis of aorta: Secondary | ICD-10-CM | POA: Diagnosis not present

## 2023-08-30 ENCOUNTER — Other Ambulatory Visit: Payer: Self-pay | Admitting: Family Medicine

## 2023-08-30 DIAGNOSIS — E041 Nontoxic single thyroid nodule: Secondary | ICD-10-CM

## 2023-08-31 DIAGNOSIS — R7309 Other abnormal glucose: Secondary | ICD-10-CM | POA: Diagnosis not present

## 2023-09-07 ENCOUNTER — Ambulatory Visit
Admission: RE | Admit: 2023-09-07 | Discharge: 2023-09-07 | Disposition: A | Discharge status: Home / Self Care | Payer: BC Managed Care – PPO | Source: Ambulatory Visit | Attending: Family Medicine | Admitting: Family Medicine

## 2023-09-07 DIAGNOSIS — E042 Nontoxic multinodular goiter: Secondary | ICD-10-CM | POA: Diagnosis not present

## 2023-09-07 DIAGNOSIS — E041 Nontoxic single thyroid nodule: Secondary | ICD-10-CM

## 2023-09-18 ENCOUNTER — Other Ambulatory Visit: Payer: Self-pay | Admitting: Family Medicine

## 2023-09-18 DIAGNOSIS — E041 Nontoxic single thyroid nodule: Secondary | ICD-10-CM

## 2023-09-30 DIAGNOSIS — R7309 Other abnormal glucose: Secondary | ICD-10-CM | POA: Diagnosis not present

## 2023-10-09 ENCOUNTER — Ambulatory Visit
Admission: RE | Admit: 2023-10-09 | Discharge: 2023-10-09 | Disposition: A | Discharge status: Home / Self Care | Payer: BC Managed Care – PPO | Source: Ambulatory Visit | Attending: Family Medicine | Admitting: Family Medicine

## 2023-10-09 ENCOUNTER — Other Ambulatory Visit (HOSPITAL_COMMUNITY)
Admission: RE | Admit: 2023-10-09 | Discharge: 2023-10-09 | Disposition: A | Discharge status: Home / Self Care | Payer: BC Managed Care – PPO | Source: Ambulatory Visit | Attending: Interventional Radiology | Admitting: Interventional Radiology

## 2023-10-09 DIAGNOSIS — E041 Nontoxic single thyroid nodule: Secondary | ICD-10-CM | POA: Insufficient documentation

## 2023-10-09 DIAGNOSIS — R888 Abnormal findings in other body fluids and substances: Secondary | ICD-10-CM | POA: Insufficient documentation

## 2023-10-09 NOTE — Procedures (Signed)
Interventional Radiology Procedure Note  Procedure:   US guided FNA biopsy of right superior thyroid nodule.   Complications: None Recommendations:  - Ok to shower tomorrow - Do not submerge for 7 days - Routine care   Signed,  Yvone Neu. Loreta Ave, DO

## 2023-10-11 LAB — CYTOLOGY - NON PAP

## 2023-10-13 DIAGNOSIS — E042 Nontoxic multinodular goiter: Secondary | ICD-10-CM | POA: Diagnosis not present

## 2023-10-23 ENCOUNTER — Encounter (HOSPITAL_COMMUNITY): Payer: Self-pay

## 2023-11-27 DIAGNOSIS — C50512 Malignant neoplasm of lower-outer quadrant of left female breast: Secondary | ICD-10-CM | POA: Diagnosis not present

## 2023-11-27 DIAGNOSIS — Z171 Estrogen receptor negative status [ER-]: Secondary | ICD-10-CM | POA: Diagnosis not present

## 2023-11-27 DIAGNOSIS — Z6841 Body Mass Index (BMI) 40.0 and over, adult: Secondary | ICD-10-CM | POA: Diagnosis not present

## 2024-01-22 DIAGNOSIS — M26609 Unspecified temporomandibular joint disorder, unspecified side: Secondary | ICD-10-CM | POA: Diagnosis not present

## 2024-01-29 DIAGNOSIS — D171 Benign lipomatous neoplasm of skin and subcutaneous tissue of trunk: Secondary | ICD-10-CM | POA: Diagnosis not present

## 2024-01-29 DIAGNOSIS — D2262 Melanocytic nevi of left upper limb, including shoulder: Secondary | ICD-10-CM | POA: Diagnosis not present

## 2024-01-29 DIAGNOSIS — D485 Neoplasm of uncertain behavior of skin: Secondary | ICD-10-CM | POA: Diagnosis not present

## 2024-01-29 DIAGNOSIS — D225 Melanocytic nevi of trunk: Secondary | ICD-10-CM | POA: Diagnosis not present

## 2024-01-29 DIAGNOSIS — L918 Other hypertrophic disorders of the skin: Secondary | ICD-10-CM | POA: Diagnosis not present

## 2024-01-29 DIAGNOSIS — L821 Other seborrheic keratosis: Secondary | ICD-10-CM | POA: Diagnosis not present

## 2024-02-01 DIAGNOSIS — R590 Localized enlarged lymph nodes: Secondary | ICD-10-CM | POA: Diagnosis not present

## 2024-02-01 DIAGNOSIS — M26609 Unspecified temporomandibular joint disorder, unspecified side: Secondary | ICD-10-CM | POA: Diagnosis not present

## 2024-02-01 DIAGNOSIS — R599 Enlarged lymph nodes, unspecified: Secondary | ICD-10-CM | POA: Diagnosis not present

## 2024-02-25 DIAGNOSIS — I10 Essential (primary) hypertension: Secondary | ICD-10-CM | POA: Diagnosis not present

## 2024-02-25 DIAGNOSIS — E785 Hyperlipidemia, unspecified: Secondary | ICD-10-CM | POA: Diagnosis not present

## 2024-02-25 DIAGNOSIS — I700 Atherosclerosis of aorta: Secondary | ICD-10-CM | POA: Diagnosis not present

## 2024-02-25 DIAGNOSIS — I2510 Atherosclerotic heart disease of native coronary artery without angina pectoris: Secondary | ICD-10-CM | POA: Diagnosis not present

## 2024-03-14 ENCOUNTER — Telehealth: Payer: Self-pay | Admitting: Cardiology

## 2024-03-14 NOTE — Telephone Encounter (Signed)
 Patient wants a provider switch to Dr. Veryl Gottron.

## 2024-03-25 DIAGNOSIS — Z6841 Body Mass Index (BMI) 40.0 and over, adult: Secondary | ICD-10-CM | POA: Diagnosis not present

## 2024-03-25 DIAGNOSIS — Z171 Estrogen receptor negative status [ER-]: Secondary | ICD-10-CM | POA: Diagnosis not present

## 2024-03-25 DIAGNOSIS — C50512 Malignant neoplasm of lower-outer quadrant of left female breast: Secondary | ICD-10-CM | POA: Diagnosis not present

## 2024-03-25 DIAGNOSIS — E66813 Obesity, class 3: Secondary | ICD-10-CM | POA: Diagnosis not present

## 2024-04-10 DIAGNOSIS — E785 Hyperlipidemia, unspecified: Secondary | ICD-10-CM | POA: Diagnosis not present

## 2024-04-10 DIAGNOSIS — I700 Atherosclerosis of aorta: Secondary | ICD-10-CM | POA: Diagnosis not present

## 2024-04-10 DIAGNOSIS — I2510 Atherosclerotic heart disease of native coronary artery without angina pectoris: Secondary | ICD-10-CM | POA: Diagnosis not present

## 2024-04-10 DIAGNOSIS — I10 Essential (primary) hypertension: Secondary | ICD-10-CM | POA: Diagnosis not present

## 2024-04-18 DIAGNOSIS — M5416 Radiculopathy, lumbar region: Secondary | ICD-10-CM | POA: Diagnosis not present

## 2024-04-21 ENCOUNTER — Other Ambulatory Visit: Payer: Self-pay | Admitting: Orthopedic Surgery

## 2024-04-21 DIAGNOSIS — M5416 Radiculopathy, lumbar region: Secondary | ICD-10-CM

## 2024-04-22 DIAGNOSIS — I8311 Varicose veins of right lower extremity with inflammation: Secondary | ICD-10-CM | POA: Diagnosis not present

## 2024-04-22 DIAGNOSIS — I8312 Varicose veins of left lower extremity with inflammation: Secondary | ICD-10-CM | POA: Diagnosis not present

## 2024-04-22 DIAGNOSIS — I872 Venous insufficiency (chronic) (peripheral): Secondary | ICD-10-CM | POA: Diagnosis not present

## 2024-05-03 ENCOUNTER — Ambulatory Visit
Admission: RE | Admit: 2024-05-03 | Discharge: 2024-05-03 | Disposition: A | Discharge status: Home / Self Care | Source: Ambulatory Visit | Attending: Orthopedic Surgery | Admitting: Orthopedic Surgery

## 2024-05-03 DIAGNOSIS — M47816 Spondylosis without myelopathy or radiculopathy, lumbar region: Secondary | ICD-10-CM | POA: Diagnosis not present

## 2024-05-03 DIAGNOSIS — M48061 Spinal stenosis, lumbar region without neurogenic claudication: Secondary | ICD-10-CM | POA: Diagnosis not present

## 2024-05-03 DIAGNOSIS — M5416 Radiculopathy, lumbar region: Secondary | ICD-10-CM

## 2024-05-07 DIAGNOSIS — Z853 Personal history of malignant neoplasm of breast: Secondary | ICD-10-CM | POA: Diagnosis not present

## 2024-05-07 DIAGNOSIS — R92323 Mammographic fibroglandular density, bilateral breasts: Secondary | ICD-10-CM | POA: Diagnosis not present

## 2024-05-07 DIAGNOSIS — R921 Mammographic calcification found on diagnostic imaging of breast: Secondary | ICD-10-CM | POA: Diagnosis not present

## 2024-05-12 DIAGNOSIS — M5416 Radiculopathy, lumbar region: Secondary | ICD-10-CM | POA: Diagnosis not present

## 2024-05-13 ENCOUNTER — Other Ambulatory Visit: Payer: Self-pay | Admitting: Orthopedic Surgery

## 2024-05-13 DIAGNOSIS — M5416 Radiculopathy, lumbar region: Secondary | ICD-10-CM

## 2024-05-22 NOTE — Discharge Instructions (Signed)

## 2024-05-23 ENCOUNTER — Ambulatory Visit
Admission: RE | Admit: 2024-05-23 | Discharge: 2024-05-23 | Disposition: A | Discharge status: Home / Self Care | Source: Ambulatory Visit | Attending: Orthopedic Surgery | Admitting: Orthopedic Surgery

## 2024-05-23 DIAGNOSIS — M48061 Spinal stenosis, lumbar region without neurogenic claudication: Secondary | ICD-10-CM | POA: Diagnosis not present

## 2024-05-23 DIAGNOSIS — M5416 Radiculopathy, lumbar region: Secondary | ICD-10-CM

## 2024-05-23 DIAGNOSIS — M47817 Spondylosis without myelopathy or radiculopathy, lumbosacral region: Secondary | ICD-10-CM | POA: Diagnosis not present

## 2024-05-23 MED ORDER — METHYLPREDNISOLONE ACETATE 40 MG/ML INJ SUSP (RADIOLOG
80.0000 mg | Freq: Once | INTRAMUSCULAR | Status: AC
  Administered 2024-05-23: 80 mg via EPIDURAL

## 2024-05-23 MED ORDER — IOPAMIDOL (ISOVUE-M 200) INJECTION 41%
1.0000 mL | Freq: Once | INTRAMUSCULAR | Status: AC
  Administered 2024-05-23: 1 mL via EPIDURAL

## 2024-06-16 ENCOUNTER — Encounter (HOSPITAL_BASED_OUTPATIENT_CLINIC_OR_DEPARTMENT_OTHER): Payer: Self-pay | Admitting: Family

## 2024-06-16 ENCOUNTER — Other Ambulatory Visit (HOSPITAL_BASED_OUTPATIENT_CLINIC_OR_DEPARTMENT_OTHER): Payer: Self-pay | Admitting: Family

## 2024-06-16 ENCOUNTER — Telehealth: Payer: Self-pay

## 2024-06-16 ENCOUNTER — Other Ambulatory Visit (HOSPITAL_COMMUNITY): Payer: Self-pay

## 2024-06-16 ENCOUNTER — Ambulatory Visit (INDEPENDENT_AMBULATORY_CARE_PROVIDER_SITE_OTHER): Admitting: Family

## 2024-06-16 VITALS — BP 130/84 | HR 77 | Ht 65.0 in | Wt 388.3 lb

## 2024-06-16 DIAGNOSIS — R600 Localized edema: Secondary | ICD-10-CM

## 2024-06-16 DIAGNOSIS — I2510 Atherosclerotic heart disease of native coronary artery without angina pectoris: Secondary | ICD-10-CM

## 2024-06-16 DIAGNOSIS — E785 Hyperlipidemia, unspecified: Secondary | ICD-10-CM | POA: Diagnosis not present

## 2024-06-16 DIAGNOSIS — R5381 Other malaise: Secondary | ICD-10-CM

## 2024-06-16 DIAGNOSIS — I10 Essential (primary) hypertension: Secondary | ICD-10-CM | POA: Diagnosis not present

## 2024-06-16 MED ORDER — NEXLIZET 180-10 MG PO TABS: 1.0000 | ORAL_TABLET | Freq: Every day | ORAL | 3 refills | Status: DC

## 2024-06-16 NOTE — Telephone Encounter (Signed)
 Pharmacy Patient Advocate Encounter   Received notification from Physician's Office that prior authorization for NEXLIZET  is required/requested.   Insurance verification completed.   The patient is insured through [facility] .   Per test claim: PA required; PA submitted to above mentioned insurance via Latent Key/confirmation #/EOC [id number] Status is pending

## 2024-06-16 NOTE — Progress Notes (Signed)
 Cardiology Office Note   Date:  06/16/2024  ID:  Jill Palmer, DOB 1965/12/18, MRN [id number] PCP: Teresa Channel, MD  [facility] Providers Cardiologist:  Shelda Bruckner, MD Cardiology APP:  Vannie Reche RAMAN, NP     History of Present Illness Jill Palmer is a 58 y.o. female with hx of CAD, HTN, HLD, GERD, aortic atherosclerosis, left breast ductal carcinoma s/p lumpectomy & radiation, anxiety/depression.   02/2023 calcium  score 358 (LM 0, LAD 41, Cx 59, RCA 258) palcing her between 90 and 100th percentile for age/gender. Echo 04/03/23 LVEF 60-65%, no RWMA, mild LVH, normal RV, mild dilation ascending aorta 39mm.  08/12/23 total cholesterol 140, HDL 57, LDL 68, triglycerides 77.  Discussed the use of AI scribe software for clinical note transcription with the patient, who gave verbal consent to proceed.  History of Present Illness Jill Palmer is a 58 year old female with coronary artery disease who presents for cardiovascular follow-up and weight management.  She experiences ongoing weight gain, with lymphedema/lipemia causing significant leg swelling and limiting physical activity. Previous attempts at weight loss surgery were unsuccessful due to pre-surgery weight loss requirements and high medication costs.  She has coronary artery disease and has tried multiple statins, including rosuvastatin, atorvastatin , and pravastatin, but experienced muscle weakness and hair loss. She attributes a slipped disc and pinched nerve to statin-induced muscle weakness. She has not tried non-statin cholesterol medications. No recent chest pain episodes. Metoprolol effectively manages her heart rate.  She experiences shortness of breath, which she attributes to weight gain. She has seen a pulmonologist and uses an inhaler. Recent knee issues and difficulty accessing a pool for exercise have reduced her activity level.  Prior lipid lowering therapy Rosuvastatin (muscle  weakness) Atorvastatin  (muscle weakness/hair loss) Pravastatin (edema)  Prior antihypertensives Valsartan - diarrhea  ROS: Please see the history of present illness.    All other systems reviewed and are negative.   Studies Reviewed EKG Interpretation Date/Time:  Monday June 16 2024 08:41:45 EDT Ventricular Rate:  77 PR Interval:  164 QRS Duration:  84 QT Interval:  374 QTC Calculation: 423 R Axis:   61  Text Interpretation: Normal sinus rhythm  No acute ST/T wave changes Confirmed by Vannie Reche ([id number]) on 06/16/2024 8:44:47 AM    Cardiac Studies & Procedures   ______________________________________________________________________________________________     ECHOCARDIOGRAM  ECHOCARDIOGRAM COMPLETE 04/03/2023  Narrative ECHOCARDIOGRAM REPORT    Patient Name:   Jill Palmer Date of Exam: 04/03/2023 Medical Rec #:  [id number]       Height:       65.0 in Accession #:    [id number]      Weight:       349.0 lb Date of Birth:  05/31/1966       BSA:          2.506 m Patient Age:    57 years        BP:           148/68 mmHg Patient Gender: F               HR:           76 bpm. Exam Location:  Outpatient  Procedure: 2D Echo, Cardiac Doppler and Color Doppler  Indications:    Dyspnea R06.00  History:        Patient has prior history of Echocardiogram examinations, most recent 09/06/2018. Signs/Symptoms:Dyspnea; Risk Factors:Hypertension, Non-Smoker and Dyslipidemia.  Sonographer:    Tillman Nora  RVT RCS Referring Phys: MICHELE RICHARDSON   Sonographer Comments: Technically challenging study due to limited acoustic windows, Technically difficult study due to poor echo windows, suboptimal parasternal window, suboptimal apical window, suboptimal subcostal window and patient is obese. Image acquisition challenging due to respiratory motion and Image acquisition challenging due to patient body habitus. Definity Declined IMPRESSIONS   1. Left ventricular ejection fraction, by  estimation, is 60 to 65%. The left ventricle has normal function. The left ventricle has no regional wall motion abnormalities. There is mild left ventricular hypertrophy. Left ventricular diastolic parameters were normal. 2. Right ventricular systolic function is normal. The right ventricular size is normal. Tricuspid regurgitation signal is inadequate for assessing PA pressure. 3. The mitral valve is normal in structure. No evidence of mitral valve regurgitation. 4. The aortic valve was not well visualized. Aortic valve regurgitation is not visualized. No aortic stenosis is present. 5. Aortic dilatation noted. There is mild dilatation of the ascending aorta, measuring 39 mm. 6. The inferior vena cava is dilated in size with >50% respiratory variability, suggesting right atrial pressure of 8 mmHg.  FINDINGS Left Ventricle: Left ventricular ejection fraction, by estimation, is 60 to 65%. The left ventricle has normal function. The left ventricle has no regional wall motion abnormalities. The left ventricular internal cavity size was normal in size. There is mild left ventricular hypertrophy. Left ventricular diastolic parameters were normal.  Right Ventricle: The right ventricular size is normal. No increase in right ventricular wall thickness. Right ventricular systolic function is normal. Tricuspid regurgitation signal is inadequate for assessing PA pressure.  Left Atrium: Left atrial size was normal in size.  Right Atrium: Right atrial size was normal in size.  Pericardium: There is no evidence of pericardial effusion.  Mitral Valve: The mitral valve is normal in structure. No evidence of mitral valve regurgitation.  Tricuspid Valve: The tricuspid valve is normal in structure. Tricuspid valve regurgitation is trivial.  Aortic Valve: The aortic valve was not well visualized. Aortic valve regurgitation is not visualized. No aortic stenosis is present.  Pulmonic Valve: The pulmonic valve  was not well visualized. Pulmonic valve regurgitation is not visualized.  Aorta: The aortic root is normal in size and structure and aortic dilatation noted. There is mild dilatation of the ascending aorta, measuring 39 mm.  Venous: The inferior vena cava is dilated in size with greater than 50% respiratory variability, suggesting right atrial pressure of 8 mmHg.  IAS/Shunts: The interatrial septum was not well visualized.   LEFT VENTRICLE PLAX 2D LVIDd:         3.90 cm   Diastology LVIDs:         2.30 cm   LV e' medial:    11.30 cm/s LV PW:         0.90 cm   LV E/e' medial:  8.7 LV IVS:        1.00 cm   LV e' lateral:   9.32 cm/s LVOT diam:     1.80 cm   LV E/e' lateral: 10.5 LV SV:         50 LV SV Index:   20 LVOT Area:     2.54 cm   RIGHT VENTRICLE             IVC RV S prime:     12.90 cm/s  IVC diam: 2.20 cm TAPSE (M-mode): 2.4 cm  LEFT ATRIUM             Index LA diam:  2.70 cm 1.08 cm/m LA Vol (A2C):   46.0 ml 18.35 ml/m LA Vol (A4C):   53.5 ml 21.35 ml/m LA Biplane Vol: 51.0 ml 20.35 ml/m AORTIC VALVE             PULMONIC VALVE LVOT Vmax:   106.00 cm/s PV Vmax:       0.87 m/s LVOT Vmean:  67.600 cm/s PV Peak grad:  3.1 mmHg LVOT VTI:    0.196 m  AORTA Ao Root diam: 3.20 cm Ao Asc diam:  3.90 cm  MITRAL VALVE MV Area (PHT): 3.43 cm    SHUNTS MV Decel Time: 221 msec    Systemic VTI:  0.20 m MV E velocity: 97.90 cm/s  Systemic Diam: 1.80 cm MV A velocity: 90.50 cm/s MV E/A ratio:  1.08  Lonni Nanas MD Electronically signed by Lonni Nanas MD Signature Date/Time: 04/04/2023/6:31:33 AM    Final    MONITORS  CARDIAC EVENT MONITOR 09/13/2017  Narrative  Normal sinus rhythm.  No significant arrhythmias   CT SCANS  CT CARDIAC SCORING (SELF PAY ONLY) 03/27/2023     ______________________________________________________________________________________________      Risk Assessment/Calculations           Physical  Exam VS:  BP 130/84   Pulse 77   Ht 5' 5 (1.651 m)   Wt (!) 388 lb 4.8 oz (176.1 kg)   SpO2 97%   BMI 64.62 kg/m        Wt Readings from Last 3 Encounters:  06/16/24 (!) 388 lb 4.8 oz (176.1 kg)  06/13/23 (!) 356 lb (161.5 kg)  04/27/23 (!) 351 lb 6.4 oz (159.4 kg)    GEN: Well nourished, overweight,  well developed in no acute distress NECK: No JVD; No carotid bruits CARDIAC: RRR, no murmurs, rubs, gallops RESPIRATORY:  Clear to auscultation without rales, wheezing or rhonchi  ABDOMEN: Soft, non-tender, non-distended EXTREMITIES:  No edema; No deformity   ASSESSMENT AND PLAN Assessment & Plan CAD / Hyperlipidemia, LDL goal <70 intolerant to statins Stable with no anginal symptoms. No indication for ischemic evaluation.  Intolerant to multiple statins with side effects.  - Prescribe Nexlizet  one tablet daily. Copay card provided in clinic - Recheck cholesterol levels in October with PCP  Hypertension Blood pressure slightly above target. Currently on metoprolol succinate 50 mg daily. Discussed potential addition of amlodipine but prefers to monitor until October. - Continue metoprolol succinate 50mg  daily. - Monitor blood pressure at home and discuss with PCP in October. If BP persistently elevated, consider Amlodipine. Defer ARB as reports prior diarrhea on Valsartan.  - Consider lifestyle modifications.  Lymphedema and lipedema of lower extremities Chronic condition affecting mobility and weight. Water-based therapy can help. Access to specialized care is limited. - Refer to water-based physical therapy. - Discuss gym with pool facilities for ongoing management.  Obesity with weight management challenges Struggling with weight management. Insurance issues for medications. Lipedema complicates efforts. [facility] and dietitians can assist. - Provide information on [facility].  Chronic back pain with lumbar radiculopathy Chronic pain exacerbated by  previous statin use. Water-based therapy can reduce pain and improve function. - Refer to water-based physical therapy.         Dispo: follow up in 6 months with Dr. Lonni or Reche GORMAN Finder, NP   Signed, Reche GORMAN Finder, NP

## 2024-06-16 NOTE — Patient Instructions (Addendum)
 Medication Instructions:   START Nexlizet  one (1) tablet by mouth ( 180-10 mg) daily at 6. Waiting for a Prior Auth to get a price. Will call.   *If you need a refill on your cardiac medications before your next appointment, please call your pharmacy*  Lab Work:  Will get labs with PCP in October.   If you have labs (blood work) drawn today and your tests are completely normal, you will receive your results only by: MyChart Message (if you have MyChart) OR A paper copy in the mail If you have any lab test that is abnormal or we need to change your treatment, we will call you to review the results.  Testing/Procedures:  None ordered.   Follow-Up: At [facility], you and your health needs are our priority.  As part of our continuing mission to provide you with exceptional heart care, our providers are all part of one team.  This team includes your primary Cardiologist (physician) and Advanced Practice Providers or APPs (Physician Assistants and Nurse Practitioners) who all work together to provide you with the care you need, when you need it.  Your next appointment:   6 month(s)  Provider:   Shelda Bruckner, MD or Reche Finder, NP    We recommend signing up for the patient portal called MyChart.  Sign up information is provided on this After Visit Summary.  MyChart is used to connect with patients for Virtual Visits (Telemedicine).  Patients are able to view lab/test results, encounter notes, upcoming appointments, etc.  Non-urgent messages can be sent to your provider as well.   To learn more about what you can do with MyChart, go to [email or web address].   Other Instructions  Your physician wants you to follow-up in: 6 months. You will receive a reminder letter in the mail two months in advance. If you don't receive a letter, please call our office to schedule the follow-up appointment.  You have been referred to PT water therapy at [facility].    [facility] is located at [address], [place], [place] [address]. Hours: Monday thru Friday 7am-5pm.  Call [phone] for more information.

## 2024-06-16 NOTE — Telephone Encounter (Signed)
-----   Message from [facility] Danielle G sent at 06/16/2024  9:21 AM EDT ----- Please PA nexlizet . Thanks Edsel

## 2024-06-17 ENCOUNTER — Other Ambulatory Visit: Payer: Self-pay | Admitting: Family

## 2024-06-17 ENCOUNTER — Other Ambulatory Visit (HOSPITAL_COMMUNITY): Payer: Self-pay

## 2024-06-17 NOTE — Telephone Encounter (Signed)
 PA has been submitted and documented in separate encounter, please sign off on rx in this encounter as PA team is unable to resolve RX requests. Thank you

## 2024-06-17 NOTE — Telephone Encounter (Signed)
 PA request has been Received. New Encounter has been or will be created for follow up. For additional info see Pharmacy Prior Auth telephone encounter from 06/17/24.

## 2024-06-24 ENCOUNTER — Other Ambulatory Visit (HOSPITAL_COMMUNITY): Payer: Self-pay

## 2024-06-24 NOTE — Telephone Encounter (Signed)
 Pharmacy Patient Advocate Encounter  Received notification from [facility] that Prior Authorization for NEXLIZET  has been APPROVED from 06/16/24 to 06/16/25. Ran test claim, Copay is $10. This test claim was processed through [facility]- copay amounts may vary at other pharmacies due to pharmacy/plan contracts, or as the patient moves through the different stages of their insurance plan.

## 2024-07-04 ENCOUNTER — Encounter (HOSPITAL_BASED_OUTPATIENT_CLINIC_OR_DEPARTMENT_OTHER): Payer: Self-pay

## 2024-07-04 MED ORDER — NEXLIZET 180-10 MG PO TABS: 1.0000 | ORAL_TABLET | Freq: Every day | ORAL | 3 refills | Status: AC

## 2024-07-25 ENCOUNTER — Ambulatory Visit (HOSPITAL_BASED_OUTPATIENT_CLINIC_OR_DEPARTMENT_OTHER): Admitting: Physical Therapy

## 2024-07-29 DIAGNOSIS — Z6841 Body Mass Index (BMI) 40.0 and over, adult: Secondary | ICD-10-CM | POA: Diagnosis not present

## 2024-07-29 DIAGNOSIS — Z171 Estrogen receptor negative status [ER-]: Secondary | ICD-10-CM | POA: Diagnosis not present

## 2024-07-29 DIAGNOSIS — C50512 Malignant neoplasm of lower-outer quadrant of left female breast: Secondary | ICD-10-CM | POA: Diagnosis not present

## 2024-08-05 DIAGNOSIS — Z6841 Body Mass Index (BMI) 40.0 and over, adult: Secondary | ICD-10-CM | POA: Diagnosis not present

## 2024-08-05 DIAGNOSIS — G4730 Sleep apnea, unspecified: Secondary | ICD-10-CM | POA: Diagnosis not present

## 2024-08-05 DIAGNOSIS — I10 Essential (primary) hypertension: Secondary | ICD-10-CM | POA: Diagnosis not present

## 2024-08-12 ENCOUNTER — Encounter (HOSPITAL_BASED_OUTPATIENT_CLINIC_OR_DEPARTMENT_OTHER): Attending: Internal Medicine | Admitting: Internal Medicine

## 2024-08-12 DIAGNOSIS — I890 Lymphedema, not elsewhere classified: Secondary | ICD-10-CM | POA: Diagnosis not present

## 2024-08-21 DIAGNOSIS — I2510 Atherosclerotic heart disease of native coronary artery without angina pectoris: Secondary | ICD-10-CM | POA: Diagnosis not present

## 2024-08-21 DIAGNOSIS — E559 Vitamin D deficiency, unspecified: Secondary | ICD-10-CM | POA: Diagnosis not present

## 2024-08-21 DIAGNOSIS — I10 Essential (primary) hypertension: Secondary | ICD-10-CM | POA: Diagnosis not present

## 2024-08-21 DIAGNOSIS — Z0000 Encounter for general adult medical examination without abnormal findings: Secondary | ICD-10-CM | POA: Diagnosis not present

## 2024-08-21 DIAGNOSIS — Z23 Encounter for immunization: Secondary | ICD-10-CM | POA: Diagnosis not present

## 2024-08-21 DIAGNOSIS — Z833 Family history of diabetes mellitus: Secondary | ICD-10-CM | POA: Diagnosis not present

## 2024-08-21 DIAGNOSIS — E785 Hyperlipidemia, unspecified: Secondary | ICD-10-CM | POA: Diagnosis not present

## 2024-08-21 DIAGNOSIS — I700 Atherosclerosis of aorta: Secondary | ICD-10-CM | POA: Diagnosis not present

## 2024-08-26 ENCOUNTER — Other Ambulatory Visit: Payer: Self-pay | Admitting: Family Medicine

## 2024-08-26 DIAGNOSIS — E041 Nontoxic single thyroid nodule: Secondary | ICD-10-CM

## 2024-09-08 ENCOUNTER — Ambulatory Visit
Admission: RE | Admit: 2024-09-08 | Discharge: 2024-09-08 | Disposition: A | Discharge status: Home / Self Care | Source: Ambulatory Visit | Attending: Family Medicine | Admitting: Family Medicine

## 2024-09-08 DIAGNOSIS — E042 Nontoxic multinodular goiter: Secondary | ICD-10-CM | POA: Diagnosis not present

## 2024-09-08 DIAGNOSIS — E041 Nontoxic single thyroid nodule: Secondary | ICD-10-CM

## 2024-10-07 DIAGNOSIS — I890 Lymphedema, not elsewhere classified: Secondary | ICD-10-CM | POA: Diagnosis not present

## 2024-10-14 DIAGNOSIS — I890 Lymphedema, not elsewhere classified: Secondary | ICD-10-CM | POA: Diagnosis not present

## 2024-10-16 DIAGNOSIS — L821 Other seborrheic keratosis: Secondary | ICD-10-CM | POA: Diagnosis not present

## 2024-10-29 DIAGNOSIS — R946 Abnormal results of thyroid function studies: Secondary | ICD-10-CM | POA: Diagnosis not present

## 2024-11-13 NOTE — Telephone Encounter (Signed)
Ok by me as well.

## 2025-01-27 ENCOUNTER — Ambulatory Visit (HOSPITAL_BASED_OUTPATIENT_CLINIC_OR_DEPARTMENT_OTHER): Admitting: Cardiology
